# Patient Record
Sex: Female | Born: 1970 | Race: White | Hispanic: No | Marital: Single
Health system: Midwestern US, Community
[De-identification: ages and names within clinical notes are randomized; demographics above are authoritative.]

## PROBLEM LIST (undated history)

## (undated) DIAGNOSIS — D500 Iron deficiency anemia secondary to blood loss (chronic): Secondary | ICD-10-CM

## (undated) DIAGNOSIS — R6510 Systemic inflammatory response syndrome (SIRS) of non-infectious origin without acute organ dysfunction: Secondary | ICD-10-CM

## (undated) DIAGNOSIS — R7989 Other specified abnormal findings of blood chemistry: Secondary | ICD-10-CM

## (undated) DIAGNOSIS — S27809A Unspecified injury of diaphragm, initial encounter: Secondary | ICD-10-CM

## (undated) DIAGNOSIS — R519 Headache, unspecified: Secondary | ICD-10-CM

## (undated) DIAGNOSIS — H8109 Meniere's disease, unspecified ear: Secondary | ICD-10-CM

## (undated) DIAGNOSIS — I10 Essential (primary) hypertension: Secondary | ICD-10-CM

## (undated) DIAGNOSIS — N951 Menopausal and female climacteric states: Secondary | ICD-10-CM

## (undated) DIAGNOSIS — D501 Sideropenic dysphagia: Secondary | ICD-10-CM

## (undated) DIAGNOSIS — K9041 Non-celiac gluten sensitivity: Secondary | ICD-10-CM

## (undated) DIAGNOSIS — F329 Major depressive disorder, single episode, unspecified: Secondary | ICD-10-CM

## (undated) DIAGNOSIS — R5382 Chronic fatigue, unspecified: Secondary | ICD-10-CM

## (undated) DIAGNOSIS — R42 Dizziness and giddiness: Secondary | ICD-10-CM

## (undated) DIAGNOSIS — F32A Depression, unspecified: Secondary | ICD-10-CM

## (undated) DIAGNOSIS — G43909 Migraine, unspecified, not intractable, without status migrainosus: Secondary | ICD-10-CM

## (undated) DIAGNOSIS — J45909 Unspecified asthma, uncomplicated: Secondary | ICD-10-CM

## (undated) DIAGNOSIS — F4310 Post-traumatic stress disorder, unspecified: Secondary | ICD-10-CM

## (undated) DIAGNOSIS — F419 Anxiety disorder, unspecified: Secondary | ICD-10-CM

## (undated) HISTORY — DX: Anxiety disorder, unspecified: F41.9

## (undated) HISTORY — DX: Non-celiac gluten sensitivity: K90.41

## (undated) HISTORY — DX: Major depressive disorder, single episode, unspecified: F32.9

## (undated) HISTORY — DX: Dizziness and giddiness: R42

## (undated) HISTORY — PX: OVARIAN CYST REMOVAL: SHX89

## (undated) HISTORY — DX: Meniere's disease, unspecified ear: H81.09

## (undated) HISTORY — DX: Unspecified asthma, uncomplicated: J45.909

## (undated) HISTORY — DX: Migraine, unspecified, not intractable, without status migrainosus: G43.909

## (undated) HISTORY — DX: Sideropenic dysphagia: D50.1

## (undated) HISTORY — DX: Depression, unspecified: F32.A

## (undated) HISTORY — DX: Post-traumatic stress disorder, unspecified: F43.10

## (undated) HISTORY — DX: Essential (primary) hypertension: I10

## (undated) HISTORY — PX: HIATAL HERNIA REPAIR: SHX195

## (undated) HISTORY — DX: Chronic fatigue, unspecified: R53.82

## (undated) HISTORY — DX: Menopausal and female climacteric states: N95.1

---

## 2014-09-03 ENCOUNTER — Ambulatory Visit
Admit: 2014-09-03 | Discharge: 2014-09-03 | Payer: PRIVATE HEALTH INSURANCE | Attending: Internal Medicine | Primary: Internal Medicine

## 2014-09-03 DIAGNOSIS — D500 Iron deficiency anemia secondary to blood loss (chronic): Secondary | ICD-10-CM

## 2014-09-03 NOTE — Telephone Encounter (Signed)
Patient says the Psychiatrist referred does not accept her insurance nor are they accepting new patients. She' requesting another. She also says that she needs a refill on her Ferroset didn't see on the list.

## 2014-09-03 NOTE — Progress Notes (Signed)
This is a new patient named Sandra KaufmanRobin Heinzelman, a 44 y.o. white female who has a long history of depression, a long history of mild intermittent asthma, who recently has been quite ill.  She was found by her primary doctor to be very anemic and iron deficient.  She gets no menstrual cycles.  Her hemoglobin dropped suddenly from 9 to 5.5 over a three week period.  She was hospitalized and had a full GI workup.  Had four or five units of blood.  Had IV iron.  She brings me in a hemoglobin of 5.5 at that time, she had an MCV of 78.  Electrolytes were normal.      The patient's hospital stay included an EGD and colonoscopy.  She has a large hiatal hernia which is old.  They saw no source of bleeding.  Dr. Dimitri PedHaverty did her GI workup.  She is now following with Dr. Nunzio CoryLyons as an outpatient.  Her iron sat was 8%.  Her iron was 39.  Total iron binding capacity was 518.  Patient was not taking any NSAID's.  She is now taking ferrous sulfate 325 three times daily.      Two days after her hospital stay, she got sick with shortness of breath and fever and was hospitalized with bilateral pneumonia.  It was felt to either be a systemic inflammatory response syndrome, or ARDS, or pneumonia.  Cultures were negative.  Mycoplasma testing is pending.  She was intubated on a ventilator for five days, treated with high doses of steroids, high doses of antibiotics, and improved.  She has been home for about a week.      She takes Nexium twice a day, iron daily.  She is on 80 mg of Prozac, 800 mg of Gabapentin at night for sleep.  She uses occasional Fioricet.  She uses Albuterol and she uses Symbicort twice a day.  She is on no aspirin products.      She has a follow up appointment with pulmonary.  Has a follow up appointment with GI.  She has not had her blood redone.      Physical Examination:  She is feeling better.  She looks well.  She is a little cushingoid.  Her blood pressure was normal.  Her chest was clear.   S1, S2 was regular.  Abdomen soft.  Was not pale.      Impression:  GI bleed.  Hemorrhage may be related to the large hiatal hernia.  She tells me there is consideration of doing a hiatal hernia repair.  I warned her to make sure she has a Careers advisersurgeon who is very accomplished at that procedure.  I suggested that the GI workup may have missed the bleeding area.  Capsule endoscopy is in the plans, and possibly repair the hiatal hernia is in the plans.  Her depression is ongoing, needs a psychiatrist.  Followed by Dr. Albertha GheeSherman Masters for many years and he is no longer in town.  He is retired.  She really has not found anybody and does not feel like her present medications are helping enough.  She has been on multiple other meds.  Has never had a psych admission.  The asthma has been pretty stable over the years.  Presently using Symbicort without using any breakthrough inhalers.      Plan:  Recommendation would be repeat the labs now.  Psychiatry referral, I gave her the name of Dr. Arletha PiliPiedra as a possible source to follow with her.  I recommended that she keep her follow up with pulmonary, keep her follow up with GI.  Have them enclose follow up letters to me.  Labs now.  IV infusion of iron would be a consideration if she remains iron deficient.  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  Continue on the proton pump inhibitors at present dosage for now.     Chief Complaint   Patient presents with   ??? Establish Care     low hemoglobin given 3 bloods transfusions, double PNA, finishing Prednisone     Filed Vitals:    09/03/14 1043   BP: 143/92   Pulse: 85   Temp: 99 ??F (37.2 ??C)   TempSrc: Oral   Resp: 16   Height:  (1.549 m)   Weight: 195 lb (88.451 kg)   SpO2: 98%   neuro normal  Skin normal  Joints normal  Mood sad  Neck normal no mass  \\Alyla was seen today for establish care.    Diagnoses and all orders for this visit:    Iron deficiency anemia due to chronic blood loss  Orders:  -     CBC WITH AUTOMATED DIFF   -     FERRITIN  -     METABOLIC PANEL, COMPREHENSIVE  -     IRON PROFILE    Hiatal hernia with GERD and esophagitis  Orders:  -     CBC WITH AUTOMATED DIFF  -     FERRITIN  -     METABOLIC PANEL, COMPREHENSIVE  -     IRON PROFILE    Mild persistent asthma without complication  Orders:  -     CBC WITH AUTOMATED DIFF  -     FERRITIN  -     METABOLIC PANEL, COMPREHENSIVE  -     IRON PROFILE    ARDS (adult respiratory distress syndrome) (HCC)    Recurrent major depressive disorder, remission status unspecified (HCC)        ARDS seems resolved   Later prevnar  Depression discussed    Prolonged visit with greater than 50% of time of more than 25 minute visit spent counseling patient and family and formulation of care

## 2014-09-04 LAB — CBC WITH AUTOMATED DIFF
ABS. BASOPHILS: 0 10*3/uL (ref 0.0–0.2)
ABS. EOSINOPHILS: 0.1 10*3/uL (ref 0.0–0.4)
ABS. IMM. GRANS.: 0.1 10*3/uL (ref 0.0–0.1)
ABS. MONOCYTES: 0.6 10*3/uL (ref 0.1–0.9)
ABS. NEUTROPHILS: 8.6 10*3/uL — ABNORMAL HIGH (ref 1.4–7.0)
Abs Lymphocytes: 0.6 10*3/uL — ABNORMAL LOW (ref 0.7–3.1)
BASOPHILS: 0 %
EOSINOPHILS: 1 %
HCT: 31.6 % — ABNORMAL LOW (ref 34.0–46.6)
HGB: 9.6 g/dL — ABNORMAL LOW (ref 11.1–15.9)
IMMATURE GRANULOCYTES: 1 %
Lymphocytes: 6 %
MCH: 25 pg — ABNORMAL LOW (ref 26.6–33.0)
MCHC: 30.4 g/dL — ABNORMAL LOW (ref 31.5–35.7)
MCV: 82 fL (ref 79–97)
MONOCYTES: 6 %
NEUTROPHILS: 86 %
PLATELET: 191 10*3/uL (ref 150–379)
RBC: 3.84 x10E6/uL (ref 3.77–5.28)
RDW: 19.3 % — ABNORMAL HIGH (ref 12.3–15.4)
WBC: 10 10*3/uL (ref 3.4–10.8)

## 2014-09-04 LAB — METABOLIC PANEL, COMPREHENSIVE
A-G Ratio: 2 (ref 1.1–2.5)
ALT (SGPT): 36 IU/L — ABNORMAL HIGH (ref 0–32)
AST (SGOT): 23 IU/L (ref 0–40)
Albumin: 4.2 g/dL (ref 3.5–5.5)
Alk. phosphatase: 63 IU/L (ref 39–117)
BUN/Creatinine ratio: 17 (ref 9–23)
BUN: 14 mg/dL (ref 6–24)
Bilirubin, total: 0.3 mg/dL (ref 0.0–1.2)
CO2: 24 mmol/L (ref 18–29)
Calcium: 8.9 mg/dL (ref 8.7–10.2)
Chloride: 99 mmol/L (ref 97–108)
Creatinine: 0.84 mg/dL (ref 0.57–1.00)
GFR est AA: 97 mL/min/{1.73_m2} (ref >59)
GFR est non-AA: 84 mL/min/{1.73_m2} (ref >59)
GLOBULIN, TOTAL: 2.1 g/dL (ref 1.5–4.5)
Glucose: 111 mg/dL — ABNORMAL HIGH (ref 65–99)
Potassium: 3.7 mmol/L (ref 3.5–5.2)
Protein, total: 6.3 g/dL (ref 6.0–8.5)
Sodium: 140 mmol/L (ref 134–144)

## 2014-09-04 LAB — IRON PROFILE
Iron % saturation: 48 % (ref 15–55)
Iron: 183 ug/dL — ABNORMAL HIGH (ref 27–159)
TIBC: 378 ug/dL (ref 250–450)
UIBC: 195 ug/dL (ref 131–425)

## 2014-09-04 LAB — FERRITIN: Ferritin: 45 ng/mL (ref 15–150)

## 2014-09-06 NOTE — Progress Notes (Signed)
Quick Note:        Hemoglobin 9.6 is Better with normal iron sats    Should repeat hemoglobin in a month    ______

## 2014-09-07 NOTE — Progress Notes (Signed)
Quick Note:        Letter sent, attempted to reach patient.    ______

## 2014-09-07 NOTE — Telephone Encounter (Signed)
Notified Kamaiyah per Dr Janace ArisBrengel that Fioricet is not advised with pain meds.  She states that she is not taking any pain meds and she uses Fioricet for migraines.  In addition, she needs to call around for psych on her own.  Provided her with contact information for [facility].  Please advise.

## 2014-09-14 ENCOUNTER — Ambulatory Visit: Admit: 2014-09-14 | Payer: PRIVATE HEALTH INSURANCE | Attending: Physician Assistant | Primary: Internal Medicine

## 2014-09-14 DIAGNOSIS — R519 Headache, unspecified: Secondary | ICD-10-CM

## 2014-09-14 LAB — AMB POC URINALYSIS DIP STICK MANUAL W/O MICRO
Blood (UA POC): NEGATIVE
Glucose (UA POC): NEGATIVE
Nitrites (UA POC): NEGATIVE
Specific gravity (UA POC): 1.03 (ref 1.001–1.035)
Urobilinogen (UA POC): 0.2 (ref 0.2–1)
pH (UA POC): 5.5 (ref 4.6–8.0)

## 2014-09-14 MED ORDER — BUTALBITAL-ACETAMINOPHEN-CAFFEINE 50 MG-325 MG-40 MG TAB
50-325-40 mg | ORAL_TABLET | Freq: Four times a day (QID) | ORAL | Status: DC | PRN
Start: 2014-09-14 — End: 2014-10-16

## 2014-09-14 MED ORDER — CODEINE-BUTALBITAL-ACETAMINOPHEN-CAFFEINE 30 MG-50 MG-325 MG-40 MG CAP
50-325-40-30 mg | ORAL_CAPSULE | Freq: Four times a day (QID) | ORAL | Status: DC | PRN
Start: 2014-09-14 — End: 2014-09-14

## 2014-09-14 NOTE — Progress Notes (Signed)
Reviewed record in preparation for visit and have obtained necessary documentation.    Identified pt with two pt identifiers(name and DOB).      Health Maintenance Due   Topic   ??? Pneumococcal 19-64 Medium Risk (1 of 1 - PPSV23)   ??? DTaP/Tdap/Td series (1 - Tdap)   ??? PAP AKA CERVICAL CYTOLOGY          Chief Complaint   Patient presents with   ??? Skin Problem     Iritation of the urethra, itching, patient had a catheter, approx 2 weeks,    ??? Migraine     Had a headache for 5 days, not presenting like a migraine,         Wt Readings from Last 3 Encounters:   09/14/14 189 lb (85.73 kg)   09/03/14 195 lb (88.451 kg)     Temp Readings from Last 3 Encounters:   09/14/14 97.8 ??F (36.6 ??C) Oral   09/03/14 99 ??F (37.2 ??C) Oral     BP Readings from Last 3 Encounters:   09/14/14 165/88   09/03/14 143/92     Pulse Readings from Last 3 Encounters:   09/14/14 104   09/03/14 85           Learning Assessment:  :     Learning Assessment 09/03/2014   PRIMARY LEARNER Patient   PRIMARY LANGUAGE ENGLISH   LEARNER PREFERENCE PRIMARY READING     LISTENING   ANSWERED BY patient   RELATIONSHIP SELF       Depression Screening:  :     No flowsheet data found.    Fall Risk Assessment:  :     No flowsheet data found.    Abuse Screening:  :     Abuse Screening Questionnaire 09/14/2014   Do you ever feel afraid of your partner? N   Are you in a relationship with someone who physically or mentally threatens you? N   Is it safe for you to go home? Y       Coordination of Care Questionnaire:  :     1) Have you been to an emergency room, urgent care clinic since your last visit? no   Hospitalized since your last visit? no             2) Have you seen or consulted any other health care providers outside of [facility] since your last visit? no  (Include any pap smears or colon screenings in this section.)    3) Do you have an Advance Directive on file? no    4) Are you interested in receiving information on Advance Directives? YES       Patient is accompanied by self I have received verbal consent from Sandra White to discuss any/all medical information while they are present in the room.

## 2014-09-14 NOTE — Patient Instructions (Signed)
MyChart Activation    Thank you for requesting access to MyChart. Please follow the instructions below to securely access and download your online medical record. MyChart allows you to send messages to your doctor, view your test results, renew your prescriptions, schedule appointments, and more.    How Do I Sign Up?    1. In your internet browser, go to [email or web address]  2. Click on the First Time User? Click Here link in the Sign In box. You will be redirect to the New Member Sign Up page.  3. Enter your MyChart Access Code exactly as it appears below. You will not need to use this code after you???ve completed the sign-up process. If you do not sign up before the expiration date, you must request a new code.    MyChart Access Code: [email or web address]  Expires: 12/08/2014  3:21 PM (This is the date your MyChart access code will expire)    4. Enter the last four digits of your Social Security Number (xxxx) and Date of Birth (mm/dd/yyyy) as indicated and click Submit. You will be taken to the next sign-up page.  5. Create a MyChart ID. This will be your MyChart login ID and cannot be changed, so think of one that is secure and easy to remember.  6. Create a MyChart password. You can change your password at any time.  7. Enter your Password Reset Question and Answer. This can be used at a later time if you forget your password.   8. Enter your e-mail address. You will receive e-mail notification when new information is available in MyChart.  9. Click Sign Up. You can now view and download portions of your medical record.  10. Click the Download Summary menu link to download a portable copy of your medical information.    Additional Information    If you have questions, please visit the Frequently Asked Questions section of the MyChart website at [email or web address] Remember, MyChart is NOT to be used for urgent needs. For medical emergencies, dial 911.

## 2014-09-14 NOTE — Progress Notes (Signed)
HISTORY OF PRESENT ILLNESS  Sandra White is a 44 y.o. female.  HPI  Patient presents to the office for evaluation of headache and urethra irritation. She states she has a history of headaches but she has not seen a neurologist in many years. She reports she has been having a headache off and on now for about 5 days. She reports it feels like a band across the front of her head. She reports once it got so bad that she felt nauseated. She states she has been under some stress with trying to recuperate from her hospitalization. Also while here she states she has been having some urethra irritation. She denies having painful urination, frequency, or hesitancy. She states the irritation/itch is similar to when she has a yeast infection but she is not having vaginal discharge. She states soaks in epson salt has helped.  Review of Systems   Genitourinary: Negative for dysuria, urgency, frequency, hematuria and flank pain.   Neurological: Positive for headaches.     Blood pressure 165/88, pulse 104, temperature 97.8 ??F (36.6 ??C), temperature source Oral, resp. rate 20, height 5\' 1"  (1.549 m), weight 189 lb (85.73 kg), SpO2 99 %.    Physical Exam   Cardiovascular: Normal rate and regular rhythm.    No murmur heard.  Pulmonary/Chest: Effort normal and breath sounds normal.   Abdominal: Soft. Bowel sounds are normal. She exhibits no distension. There is no tenderness. There is no rebound.   Musculoskeletal: She exhibits no tenderness.       ASSESSMENT and PLAN  [name] was seen today for skin problem and migraine.    Diagnoses and all orders for this visit:    Headache, unspecified headache type  Orders:  -     Discontinue: codeine-butalbital-acetaminophen-caffeine (FIORICET WITH CODEINE) 50-325-40-30 mg per capsule; Take 1 Cap by mouth every six (6) hours as needed for Headache. Max Daily Amount: 4 Caps.  -     butalbital-acetaminophen-caffeine (FIORICET, ESGIC) 50-325-40 mg per  tablet; Take 1 Tab by mouth every six (6) hours as needed for Pain. Max Daily Amount: 4 Tabs.    Urethral irritation    advised patient if she has to take the fioricet often for headaches she should make an appointment to follow up with Dr. Janace Aris. I will send her urine for a culture. Will contact her once results are back.

## 2014-09-15 LAB — CULTURE, URINE
Urine Culture, Routine: NO GROWTH
Urine Culture, Routine: NO GROWTH

## 2014-09-15 NOTE — Progress Notes (Signed)
Quick Note:        Please let patient know no growth on culture. thanks    ______

## 2014-09-16 NOTE — Progress Notes (Signed)
Quick Note:        Writer contacted patient to inform her of lab results per Lambert Mody, patient understood and stated still having irritation, writer encouraged patient to make appointment with Dr. Janace Aris if irritation persists.    ______

## 2014-09-18 ENCOUNTER — Telehealth

## 2014-09-18 NOTE — Telephone Encounter (Signed)
Pt is requesting a referral she was seen by Dr. Theodoro Parma this morning at 9:45am @ [facility] on [address]  She was seen for the following: K44.9, K21.9, D50.0, and Z87.0. Phone # is [phone] and fax is [phone] NPI: [id number] Tax id: [id number]. They are referring pt to general surg. Pt will call back MON with details of that appt.

## 2014-09-23 ENCOUNTER — Telehealth

## 2014-09-23 NOTE — Telephone Encounter (Signed)
Patient called and stated that she needs a referral  Referring Provider: Dr Gae Gallop  Specialty: Pulmonary  Reason for Appointment: Hospital follow up from Frann Rider for respatory failure    Appointment Date and Time: June 29th at 8:30am  Patient also, mentioned that she needed an order for a chest x-ray because [facility] would like for her to have an x-ray of her chest before her appointment. The contact number is [phone].

## 2014-09-23 NOTE — Telephone Encounter (Signed)
Patient called back and stated that she does not need the order for the chest x-ray. The contact number is [phone].

## 2014-10-07 ENCOUNTER — Telehealth

## 2014-10-07 NOTE — Telephone Encounter (Signed)
Writer informed patient lab slip is ready for pick up

## 2014-10-07 NOTE — Telephone Encounter (Signed)
Patient wants to pick up lab slips for 1 month recheck. Call when ready.

## 2014-10-08 LAB — CBC WITH AUTOMATED DIFF
ABS. BASOPHILS: 0.1 10*3/uL (ref 0.0–0.2)
ABS. EOSINOPHILS: 0.5 10*3/uL — ABNORMAL HIGH (ref 0.0–0.4)
ABS. IMM. GRANS.: 0 10*3/uL (ref 0.0–0.1)
ABS. MONOCYTES: 0.3 10*3/uL (ref 0.1–0.9)
ABS. NEUTROPHILS: 5.1 10*3/uL (ref 1.4–7.0)
Abs Lymphocytes: 1.3 10*3/uL (ref 0.7–3.1)
BASOPHILS: 1 %
EOSINOPHILS: 7 %
HCT: 39 % (ref 34.0–46.6)
HGB: 12.8 g/dL (ref 11.1–15.9)
IMMATURE GRANULOCYTES: 0 %
Lymphocytes: 18 %
MCH: 27.5 pg (ref 26.6–33.0)
MCHC: 32.8 g/dL (ref 31.5–35.7)
MCV: 84 fL (ref 79–97)
MONOCYTES: 5 %
NEUTROPHILS: 69 %
PLATELET: 161 10*3/uL (ref 150–379)
RBC: 4.65 x10E6/uL (ref 3.77–5.28)
RDW: 18.7 % — ABNORMAL HIGH (ref 12.3–15.4)
WBC: 7.2 10*3/uL (ref 3.4–10.8)

## 2014-10-08 NOTE — Telephone Encounter (Signed)
Quick Note:        Not anemic any longer    ______

## 2014-10-09 NOTE — Telephone Encounter (Signed)
Quick Note:        Writer informed patient per Dr Janace ArisBrengel labs have been reviewed patient is no longer anemic any longer. Patient is pleased    ______

## 2014-10-09 NOTE — Telephone Encounter (Signed)
Mailed recent lab results to address on file.

## 2014-10-09 NOTE — Telephone Encounter (Signed)
Patient called and would like for her test results mailed to her. The contact number is [phone].

## 2014-10-15 NOTE — Telephone Encounter (Signed)
Patient called and stated that she needs a referral:   Referring Provider: Dr. Carmell AustriaAmy Rose   Specialty: General Surgery  Reason for the Appointment: Haital Hernia Repair  Appointment date and time: 10/21/14 @ 1:45pm

## 2014-10-16 ENCOUNTER — Ambulatory Visit
Admit: 2014-10-16 | Discharge: 2014-10-16 | Payer: PRIVATE HEALTH INSURANCE | Attending: Internal Medicine | Primary: Internal Medicine

## 2014-10-16 DIAGNOSIS — D500 Iron deficiency anemia secondary to blood loss (chronic): Secondary | ICD-10-CM

## 2014-10-16 MED ORDER — FLUOXETINE 40 MG CAP
40 mg | ORAL_CAPSULE | Freq: Every day | ORAL | Status: DC
Start: 2014-10-16 — End: 2015-02-22

## 2014-10-16 MED ORDER — BUTALBITAL-ACETAMINOPHEN-CAFFEINE 50 MG-325 MG-40 MG TAB
50-325-40 mg | ORAL_TABLET | Freq: Four times a day (QID) | ORAL | Status: DC | PRN
Start: 2014-10-16 — End: 2015-02-22

## 2014-10-16 MED ORDER — NORTRIPTYLINE 25 MG CAP
25 mg | ORAL_CAPSULE | ORAL | Status: DC
Start: 2014-10-16 — End: 2015-03-22

## 2014-10-16 MED ORDER — PROMETHAZINE 12.5 MG TAB
12.5 mg | ORAL_TABLET | Freq: Four times a day (QID) | ORAL | Status: AC | PRN
Start: 2014-10-16 — End: ?

## 2014-10-16 NOTE — Progress Notes (Signed)
Chief Complaint   Patient presents with   ??? Migraine     Frequent migraines, getting worse, Pt is seeing neurologist in aug   ??? Medication Refill     promethazine, fiorcet     Vomits nausea    Ever since her hospitalization for severe anemia and ARDS in May, she has had a lot of headaches.  She has an appointment to see Dr. Peri Maris in a couple of weeks.  She previously had used Topamax.  She has been taking some Fioricet but is reluctant to use it.  She has been taking some acetaminophen.  She is not supposed to take NSAID's because of her GI bleed.  It is not clear where she had a GI bleed from, but they think it is from her hiatal hernia.  She has been referred to General Surgery and for Amy Rose for hiatal hernia repair, which is probably a good idea.  She continues on a proton pump inhibitor.  She has been under a moderate amount of stress trying to get her life together.  Her most recent CBC was normal.  She has had classic migraines for years, but much worse lately.  She said she never had good relief with triptans, although only remembers one or two that she has taken before.      Physical Examination:  Her exam was benign.  Nothing significant.  Her blood pressure was normal.  Neck was supple.      Impression/Plan:  I recommended a trial of Nortriptyline.  Try 25 mg at bedtime, titrate up to 75 mg at bedtime.  Use that instead of Gabapentin as a migraine prophylaxis.  A small amount of Fioricet was given with warnings about rebound headaches.  Neurology appointment.  I refilled her antidepressants until she gets a psychiatrist.  So far has not been able to find a psychiatrist under her plan that is accepting new patients, which is really unfortunate.  The [facility] psychiat[facility] group, mental health group, also is taking no new patients which is also quite unfortunate.  I do not think we should be taking these [facility] patients if they cannot get psychiatric care or mental health care.  Therefore I  have filled those meds.  I will see her back in a few months.  Referral to Amy Rose for general surgery for hiatal hernia repair.  I think she is the best in the community at the procedure, has a lot of experience so I feel comfortable that she would be the best person to do that procedure for her.  I do not know of anybody else in the community that does frequent hiatal hernia repairs and is skilled at it.     Filed Vitals:    10/16/14 0854   BP: 157/107   Pulse: 111   Temp: 98.4 ??F (36.9 ??C)   TempSrc: Oral   Resp: 16   Height:  (1.549 m)   Weight: 186 lb (84.369 kg)     Sandra White was seen today for migraine and medication refill.    Diagnoses and all orders for this visit:    Iron deficiency anemia due to chronic blood loss  Orders:  -     FLUoxetine (PROZAC) 40 mg capsule; Take 2 Caps by mouth daily.    Hiatal hernia with GERD and esophagitis  Orders:  -     FLUoxetine (PROZAC) 40 mg capsule; Take 2 Caps by mouth daily.  -     REFERRAL TO GENERAL  SURGERY    Mild persistent asthma without complication  Orders:  -     FLUoxetine (PROZAC) 40 mg capsule; Take 2 Caps by mouth daily.    Headache, unspecified headache type  Orders:  -     butalbital-acetaminophen-caffeine (FIORICET, ESGIC) 50-325-40 mg per tablet; Take 1 Tab by mouth every six (6) hours as needed for Pain. Max Daily Amount: 4 Tabs.    Chronic daily headache    Other orders  -     nortriptyline (PAMELOR) 25 mg capsule; One at HS, increase up 3 at HS  -     promethazine (PHENERGAN) 12.5 mg tablet; Take 1 Tab by mouth every six (6) hours as needed for Nausea.

## 2014-10-29 LAB — AMB EXT CREATININE: Creatinine, External: 0.73

## 2014-11-03 ENCOUNTER — Encounter: Attending: Specialist | Primary: Internal Medicine

## 2014-12-21 ENCOUNTER — Encounter

## 2014-12-21 ENCOUNTER — Ambulatory Visit
Admit: 2014-12-21 | Discharge: 2014-12-21 | Payer: PRIVATE HEALTH INSURANCE | Attending: Specialist | Primary: Internal Medicine

## 2014-12-21 DIAGNOSIS — G43009 Migraine without aura, not intractable, without status migrainosus: Secondary | ICD-10-CM

## 2014-12-21 MED ORDER — SUMATRIPTAN 100 MG TAB
100 mg | ORAL_TABLET | Freq: Once | ORAL | 6 refills | Status: DC | PRN
Start: 2014-12-21 — End: 2015-11-15

## 2014-12-21 NOTE — Patient Instructions (Addendum)
Information Regarding Testing and Medication    If you have physican order for a test or a medication denied by your insurance company, this does not mean the test or medication is not appropriate for you as that is a medical decision, not a decision to be made by an insurance company representative or by an [employer] company physician who has not interviewed and examined you. This is a decision to be made between you and your physician.     The denial of services is a Personnel officercontractual matter between you and your insurance company, not an issue between your physician and the insurance company. If your test or medication is denied, you can take the following steps to help resolve the issue:    1.  File a complaint with the [employer] regarding your insurance company's denial of services ordered for you.  You can do this either by calling them directly or by completing an on-line complaint form on the [employer] Commission website.  This can be found at [email or web address]    2.   Also file a formal complaint with your insurance company and ask to have the name of the person denying the service so that you may explore a legal option should you be harmed by this denial of service.  Again, the fact the insurance company will not pay for the service does not mean it is not medically necessary and I would encourage you to follow through with the plan that was made with your physician    3.   File a written complaint with your employer so your employer and benefit manager is aware of the poor coverage they are providing their employees.  If you have medicare/medicaid, complain to your representative in the House and to your [place]ator.          PRESCRIPTION REFILL POLICY    [facility]   Statement to Patients  July 02, 2012      In an effort to ensure the large volume of patient prescription refills is processed in the most efficient and expeditious manner, we are asking our  patients to assist us by calling your Pharmacy for all prescription refills, this will include also your  Mail Order Pharmacy. The pharmacy will contact our office electronically to continue the refill process.    Please do not wait until the last minute to call your pharmacy. We need at least 48 hours (2days) to fill prescriptions. We also encourage you to call your pharmacy before going to pick up your prescription to make sure it is ready.     With regard to controlled substance prescription refill requests (narcotic refills) that need to be picked up at our office, we ask your cooperation by providing us with at least 72 hours (3days) notice that you will need a refill.    We will not refill narcotic prescription refill requests after 4:00pm on any weekday, Monday through Thursday, or after 2:00pm on Fridays, or on the weekends.      We encourage everyone to explore another way of getting your prescription refill request processed using MyChart, our patient web portal through our electronic medical record system. MyChart is an efficient and effective way to communicate your medication request directly to the office and  downloadable as an app on your smart phone . MyChart also features a review functionality that allows you to view your medication list as well as leave messages for your physician. Are you ready to  get connected? If so please review the attatched instructions or speak to any of our staff to get you set up right away!    Thank you so much for your cooperation. Should you have any questions please contact our Research officer, political party.    The Physicians and Staff,  [facility]               Migraine Headache: Care Instructions  Your Care Instructions  Migraines are painful, throbbing headaches that often start on one side of the head. They may cause nausea and vomiting and make you sensitive to light, sound, or smell.  Without treatment, migraines can last from 4 hours to a few days.  Medicines can help prevent migraines or stop them after they have started. Your doctor can help you find which ones work best for you.  Follow-up care is a key part of your treatment and safety. Be sure to make and go to all appointments, and call your doctor if you are having problems. It's also a good idea to know your test results and keep a list of the medicines you take.  How can you care for yourself at home?  ?? Do not drive if you have taken a prescription pain medicine.  ?? Rest in a quiet, dark room until your headache is gone. Close your eyes, and try to relax or go to sleep. Don't watch TV or read.  ?? Put a cold, moist cloth or cold pack on the painful area for 10 to 20 minutes at a time. Put a thin cloth between the cold pack and your skin.  ?? Use a warm, moist towel or a heating pad set on low to relax tight shoulder and neck muscles.  ?? Have someone gently massage your neck and shoulders.  ?? Take your medicines exactly as prescribed. Call your doctor if you think you are having a problem with your medicine. You will get more details on the specific medicines your doctor prescribes.  ?? Be careful not to take pain medicine more often than the instructions allow. You could get worse or more frequent headaches when the medicine wears off.  To prevent migraines  ?? Keep a headache diary so you can figure out what triggers your headaches. Avoiding triggers may help you prevent headaches. Record when each headache began, how long it lasted, and what the pain was like. (Was it throbbing, aching, stabbing, or dull?) Write down any other symptoms you had with the headache, such as nausea, flashing lights or dark spots, or sensitivity to bright light or loud noise. Note if the headache occurred near your period. List anything that might have triggered the headache. Triggers may include certain foods (chocolate, cheese, wine) or odors, smoke, bright light, stress, or lack of sleep.   ?? If your doctor has prescribed medicine for your migraines, take it as directed. You may have medicine that you take only when you get a migraine and medicine that you take all the time to help prevent migraines.  ?? If your doctor has prescribed medicine for when you get a headache, take it at the first sign of a migraine, unless your doctor has given you other instructions.  ?? If your doctor has prescribed medicine to prevent migraines, take it exactly as prescribed. Call your doctor if you think you are having a problem with your medicine.  ?? Find healthy ways to deal with stress. Migraines are most common during or right after stressful times.  Take time to relax before and after you do something that has caused a migraine in the past.  ?? Try to keep your muscles relaxed by keeping good posture. Check your jaw, face, neck, and shoulder muscles for tension. Try to relax them. When you sit at a desk, change positions often. And make sure to stretch for 30 seconds each hour.  ?? Get plenty of sleep and exercise.  ?? Eat meals on a regular schedule. Avoid foods and drinks that often trigger migraines. These include chocolate, alcohol (especially red wine and port), aspartame, monosodium glutamate (MSG), and some additives found in foods (such as hot dogs, bacon, cold cuts, aged cheeses, and pickled foods).  ?? Limit caffeine. Don't drink too much coffee, tea, or soda. But don't quit caffeine suddenly. That can also give you migraines.  ?? Do not smoke or allow others to smoke around you. If you need help quitting, talk to your doctor about stop-smoking programs and medicines. These can increase your chances of quitting for good.  ?? If you are taking birth control pills or hormone therapy, talk to your doctor about whether they are triggering your migraines.  When should you call for help?  Call 911 anytime you think you may need emergency care. For example, call if:  ?? You have signs of a stroke. These may include:   ?? Sudden numbness, paralysis, or weakness in your face, arm, or leg, especially on only one side of your body.  ?? Sudden vision changes.  ?? Sudden trouble speaking.  ?? Sudden confusion or trouble understanding simple statements.  ?? Sudden problems with walking or balance.  ?? A sudden, severe headache that is different from past headaches.  Call your doctor now or seek immediate medical care if:  ?? You have new or worse nausea and vomiting.  ?? You have a new or higher fever.  ?? Your headache gets much worse.  Watch closely for changes in your health, and be sure to contact your doctor if:  ?? You are not getting better after 2 days (48 hours).  Where can you learn more?  Go to InsuranceStats.ca  Enter [phone] in the search box to learn more about "Migraine Headache: Care Instructions."  ?? 2006-2016 [employer]wise, Inc[employer]. Care instructions adapted under license by [employer] (which disclaims liability or warranty for this information). This care instruction is for use with your licensed healthcare professional. If you have questions about a medical condition or this instruction, always ask your healthcare professional. Healthwise, Incorporated disclaims any warranty or liability for your use of this information.  Content Version: 10.9.538570; Current as of: May 22, 2014        Recurring Migraine Headache: Care Instructions  Your Care Instructions  Migraines are painful, throbbing headaches. They often start on one side of the head. They may cause nausea and vomiting and make you sensitive to light, sound, or smell. Some people may have only a few migraines throughout life. Others have them as often as several times a month.  The goal of treatment is to reduce the number of migraines you have and relieve your symptoms. Even with treatment, you may continue to have migraines. You play an important role in dealing with your headaches. Work  on avoiding things that seem to trigger your migraines. When you feel a headache coming on, act quickly to stop it before it gets worse.  Follow-up care is a key part of your treatment and safety. Be sure to  make and go to all appointments, and call your doctor if you are having problems. It's also a good idea to know your test results and keep a list of the medicines you take.  How can you care for yourself at home?  ?? Do not drive if you have taken a prescription pain medicine.  ?? Rest in a quiet, dark room until your headache is gone. Close your eyes and try to relax or go to sleep. Do not watch TV or read.  ?? Put a cold, moist cloth or cold pack on the painful area for 10 to 20 minutes at a time. Put a thin cloth between the cold pack and your skin.  ?? Have someone gently massage your neck and shoulders.  ?? Take your medicines exactly as prescribed. Call your doctor if you think you are having a problem with your medicine. You will get more details on the specific medicines your doctor prescribes.  To prevent migraines  ?? Keep a headache diary so you can figure out what triggers your headaches. Avoiding triggers may help you prevent headaches. Record when each headache began, how long it lasted, and what the pain was like. Use words like throbbing, aching, stabbing, or dull. Write down any other symptoms you had with the headache. These may include nausea, flashing lights or dark spots, or sensitivity to bright light or loud noise. Note if the headache occurred near your period. List anything that might have triggered the headache. Triggers may include certain foods (chocolate, cheese, wine) or odors, smoke, bright light, stress, or lack of sleep.  ?? If your doctor has prescribed medicine for your migraines, take it as directed. You may have medicine that you take only when you get a migraine and medicine that you take all the time to help prevent migraines.   ?? If your doctor has prescribed medicine for when you get a headache, take it at the first sign of a migraine, unless your doctor has given you other instructions.  ?? If your doctor has prescribed medicine to prevent migraines, take it exactly as prescribed. Call your doctor if you think you are having a problem with your medicine.  ?? Find healthy ways to deal with stress. Migraines are most common during or right after stressful times. Take time to relax before and after you do something that has caused a migraine in the past.  ?? Try to keep your muscles relaxed by keeping good posture. Check your jaw, face, neck, and shoulder muscles for tension. Try to relax them. When sitting at a desk, change positions often. Stretch for 30 seconds each hour.  ?? Get regular sleep and exercise.  ?? Eat regular meals, and avoid foods and drinks that often trigger migraines. These include chocolate and alcohol, especially red wine and port. Chemicals used in food, such as aspartame and monosodium glutamate (MSG), also can trigger migraines. So can some food additives, such as those found in hot dogs, bacon, cold cuts, aged cheeses, and pickled foods.  ?? Limit caffeine by not drinking too much coffee, tea, or soda. Do not quit caffeine suddenly, because that can also give you migraines.  ?? Do not smoke or allow others to smoke around you. If you need help quitting, talk to your doctor about stop-smoking programs and medicines. These can increase your chances of quitting for good.  ?? If you are taking birth control pills or hormone therapy, talk to your doctor about whether they are triggering your  migraines.  When should you call for help?  Call 911 anytime you think you may need emergency care. For example, call if:  ?? You have symptoms of a stroke. These may include:  ?? Sudden numbness, tingling, weakness, or loss of movement in your face, arm, or leg, especially on only one side of your body.  ?? Sudden vision changes.   ?? Sudden trouble speaking.  ?? Sudden confusion or trouble understanding simple statements.  ?? Sudden problems with walking or balance.  ?? A sudden, severe headache that is different from past headaches.  Call your doctor now or seek immediate medical care if:  ?? You develop a fever and a stiff neck.  ?? You have new nausea and vomiting, or you cannot keep down food or liquids.  Watch closely for changes in your health, and be sure to contact your doctor if:  ?? You have a headache that does not get better within 1 or 2 days.  ?? Your headaches get worse or happen more often.  Where can you learn more?  Go to InsuranceStats.ca  Enter V975 in the search box to learn more about "Recurring Migraine Headache: Care Instructions."  ?? 2006-2016 Healthwise, Incorporated. Care instructions adapted under license by [employer] (which disclaims liability or warranty for this information). This care instruction is for use with your licensed healthcare professional. If you have questions about a medical condition or this instruction, always ask your healthcare professional. Healthwise, Incorporated disclaims any warranty or liability for your use of this information.  Content Version: 10.9.538570; Current as of: May 22, 2014        Deciding About Taking Medicine to Prevent Migraines  What are migraines?  Migraines are painful, throbbing headaches. They can last from 4 to 72 hours. They often occur on only one side of your head. But you may feel them on both sides. The pain may keep you from doing your daily activities.  You may take a daily medicine if you get bad migraines often. This can help prevent them.  What are key points about this decision?  ?? Medicines to prevent migraines may not stop them every time. But if you take them daily, you can reduce how many migraines you get by more than half. They can also reduce how long migraines last. And your symptoms may not be as bad.   ?? Medicines that prevent migraines may cause side effects. You may have sleep and memory problems, upset stomach, dry mouth, or constipation. Some of these side effects may last for as long as you take the medicine. Or they may go away within a few weeks.  Why might you choose to take medicine to prevent migraines?  ?? You are willing to take medicine daily if it will help your symptoms.  ?? You don't think the side effects of the medicine could be as bad as your migraine symptoms.  ?? Your migraines get in the way of your work. Or they harm your relationships with friends and family.  ?? Benefits of medicine include fewer or no migraines. And your migraines may not last as long or feel as bad.  Why might you choose not to take medicine to prevent migraines?  ?? You want to avoid the side effects of the medicine.  ?? You don't want to take medicine every day.  ?? Your migraines are not affecting your work and relationships.  ?? If your symptoms don't improve with home treatment and other medicines,  you can decide later to take medicine every day to help prevent migraines.  Your decision  Thinking about the facts and your feelings can help you make a decision that is right for you. Be sure you understand the benefits and risks of your options, and think about what else you need to do before you make the decision.  Where can you learn more?  Go to [employer]Stats.[employer]  Enter (313) [phone] in the search box to learn more about "Deciding About Taking Medicine to Prevent Migraines."  ?? 2006-2016 Healthwise, Inc[employer]. Care instructions adapted under license by [employer] (which disclaims liability or warranty for this information). This care instruction is for use with your licensed healthcare professional. If you have questions about a medical condition or this instruction, always ask your healthcare professional. Healthwise, Incorporated disclaims any warranty or liability for your use of this  information.  Content Version: 10.9.538570; Current as of: May 22, 2014  Will suggest patient migraines be treated with either Imitrex injectable or tablets either of which can be taken with over-the-counter ibuprofen or Aleve at moment 0.  Suggest she keep a headache diary and continue on the same dose of nortriptyline for now.  We can go higher.  Exam looks normal.

## 2014-12-21 NOTE — Progress Notes (Signed)
Reviewed record in preparation for visit and have necessary documentation  Pt did not bring medication to office visit for review  Information was given to pt on Advanced Directives, Living Will  opportunity was given for questions

## 2014-12-21 NOTE — Progress Notes (Signed)
Recurrent migraines.  Neurology Consult      Subjective:      Sandra White is a 44 y.o. 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  She saw me at my previous practice back in the 1990s for migraines.  Said an MRI was done at the time which was okay.  Headaches mellowed for a while down to 1 per month.  Then started fertility treatments in 2007 and headaches unfortunately became more intense and difficult to treat.  Was treated with Phenergan and Fioricet but headaches became worse any way.  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  Has recently been tapered on the Fioricet and nortriptyline put in place as a preventative drug at 75 mg daily currently.  Headaches seem to have responded.  Currently has 2 different type of headache presentations.  These are headaches with and without visual aura.  The headaches are otherwise the same.  With visual aura there is a preceding flashing of light in both eyes that last 10 or 15 minutes and then the headache starts.  Typically on the left side around the eye with nausea.  Headache can last between 4 hours or all day.  Enhanced by everything.  Diminished slightly with the Phenergan and is tapering off the Fioricet.  Says stress is definitely in the picture and sleep reveals chronic insomnia.  For some reason mentioned that obstructive sleep apnea was casually referenced at one point, but talking with the pulmonologist later it apparently was not a legitimate concern?    Current Outpatient Prescriptions   Medication Sig Dispense Refill   ??? mometasone-formoterol (DULERA) 100-5 mcg/actuation HFA inhaler Take 2 Puffs by inhalation two (2) times a day.     ??? tretinoin (RETIN-A) 0.025 % topical cream Apply  to affected area nightly.     ??? SUMAtriptan (IMITREX) 100 mg tablet Take 1 Tab by mouth once as needed for Migraine for up to 1 dose. 12 Tab 6   ??? nortriptyline (PAMELOR) 25 mg capsule One at HS, increase up 3 at HS (Patient taking differently: 75 mg. .   ) 90 Cap 5`   ??? butalbital-acetaminophen-caffeine (FIORICET, ESGIC) 50-325-40 mg per tablet Take 1 Tab by mouth every six (6) hours as needed for Pain. Max Daily Amount: 4 Tabs. 40 Tab 0   ??? promethazine (PHENERGAN) 12.5 mg tablet Take 1 Tab by mouth every six (6) hours as needed for Nausea. 50 Tab 1   ??? VENTOLIN HFA 90 mcg/actuation inhaler   12   ??? albuterol (PROVENTIL VENTOLIN) 2.5 mg /3 mL (0.083 %) nebulizer solution 2.5 mg by Nebulization route every eight (8) hours as needed.  1   ??? clonazePAM (KLONOPIN) 0.5 mg tablet Take 0.5-1 mg by mouth daily as needed.     ??? gabapentin (NEURONTIN) 800 mg tablet Take 800 mg by mouth nightly.     ??? pantoprazole (PROTONIX) 20 mg tablet   3   ??? FLUoxetine (PROZAC) 40 mg capsule Take 2 Caps by mouth daily. 60 Cap 1   ??? VITAMIN D2 50,000 unit capsule Take 50,000 Units by mouth every seven (7) days.  3   ??? ferrous sulfate 325 mg (65 mg iron) tablet Take 325 mg by mouth Daily (before breakfast).        Allergies   Allergen Reactions   ??? Prednisone Other (comments)     BLURRED VISION     Past Medical History   Diagnosis Date   ??? Anemia    ???  Anxiety    ??? Burning with urination    ??? Chronic mental illness    ??? Depression    ??? H/O seasonal allergies    ??? Headache 1997     migraines   ??? Incontinence in female    ??? Insomnia    ??? Premature ovarian failure      3-4 years ago      Past Surgical History   Procedure Laterality Date   ??? Hx ovarian cyst removal  1984   ??? Hx gyn     ??? Hx hernia repair  2016     hiatal hernia      Social History     Social History   ??? Marital status: SINGLE     Spouse name: N/A   ??? Number of children: N/A   ??? Years of education: N/A     Occupational History   ??? Not on file.     Social History Main Topics   ??? Smoking status: Former Smoker   ??? Smokeless tobacco: Never Used   ??? Alcohol use 1.8 oz/week     3 Glasses of wine per week      Comment: socially   ??? Drug use: No   ??? Sexual activity: No     Other Topics Concern   ??? Not on file      Social History Narrative      Family History   Problem Relation Age of Onset   ??? Hypertension Mother    ??? Heart Disease Mother    ??? Alcohol abuse Father    ??? Hypertension Father    ??? Cancer Father    ??? Heart Disease Father    ??? Stroke Father    ??? Breast Cancer Sister    ??? Cancer Sister       Visit Vitals   ??? BP 122/80   ??? Pulse (!) 113   ??? Temp 98.4 ??F (36.9 ??C) (Oral)   ??? Resp 18   ??? Ht  (1.549 m)   ??? Wt 83.6 kg (184 lb 3.2 oz)   ??? SpO2 98%   ??? BMI 34.8 kg/m2        Review of Systems:   A comprehensive review of systems was negative except for that written in the HPI.      Neuro Exam:     Appearance:  The patient is well developed, well nourished, provides a coherent history and is in no acute distress.   Mental Status: Oriented to time, place and person. Mood and affect appropriate.   Cranial Nerves:   Intact visual fields. Fundi are benign. PERLA, EOM's full, no nystagmus, no ptosis. Facial sensation is normal. Corneal reflexes are intact. Facial movement is symmetric. Hearing is normal bilaterally. Palate is midline with normal sternocleidomastoid and trapezius muscles are normal. Tongue is midline.   Motor:  5/5 strength in upper and lower proximal and distal muscles. Normal bulk and tone. No fasciculations.   Reflexes:   Deep tendon reflexes 2+/4 and symmetrical.   Sensory:   Normal to touch, pinprick and vibration.   Gait:  Normal gait.   Tremor:   No tremor noted.   Cerebellar:  No cerebellar signs present.   Neurovascular:  Normal heart sounds and regular rhythm, peripheral pulses intact, and no carotid bruits.            Assessment:   Recurrent migraines.  Will suggest she stay on the same dose of nortriptyline for now  but we could go higher or consider a different preventative agent.  Suggest she keep a headache diary.  Will dispense Imitrex injectable and tablets.  Either could be used at moment 0 with over-the-counter ibuprofen or Aleve.  Exam is reassuringly normal.      Plan:    Revisit in about 2 months.  Signed by :  Milana Na, MD 12/21/2014

## 2014-12-21 NOTE — Telephone Encounter (Signed)
Pt is requesting a referral to see Dr. Sim Boast (neurologist) for chronic migraines. Her appt. Is 12/21/14 @ 11 am.

## 2014-12-22 NOTE — Telephone Encounter (Signed)
[employer] PA form w/ clinical notes faxed regarding Zembrace 3/0.5 ml #0.5 /30 days

## 2014-12-28 NOTE — Telephone Encounter (Signed)
Received [place] [place] approval letter regarding Zembrace symtouch  /0.78ml #0.5 #30 days   Auth# [id number]  Effective 12/24/14-12/24/15    [facility] informed of approva; via fax# [phone]

## 2015-01-26 NOTE — Telephone Encounter (Signed)
Patient requesting a call back about vaccines my chart says she needs.

## 2015-01-26 NOTE — Telephone Encounter (Signed)
Spoke with the patient and she is going to call back to get scheduled to discuss the vaccines with Dr Janace ArisBrengel.

## 2015-02-22 ENCOUNTER — Ambulatory Visit
Admit: 2015-02-22 | Discharge: 2015-02-22 | Payer: PRIVATE HEALTH INSURANCE | Attending: Specialist | Primary: Internal Medicine

## 2015-02-22 DIAGNOSIS — G43709 Chronic migraine without aura, not intractable, without status migrainosus: Secondary | ICD-10-CM

## 2015-02-22 MED ORDER — PROMETHAZINE 25 MG TAB
25 mg | ORAL_TABLET | Freq: Four times a day (QID) | ORAL | 1 refills | Status: AC | PRN
Start: 2015-02-22 — End: ?

## 2015-02-22 NOTE — Progress Notes (Signed)
Neurology Consult      Subjective:      Sandra White is a 44 y.o. female who returns with chronic migraines.  Has had occasional periods of flareups that last more than several days.  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  Will suggest adding ibuprofen over-the-counter and gabapentin which she also takes and may need a Medrol Dosepak for future reference.  Is due to see psychiatry for PTSD.  Will increase nightly nortriptyline to 100 mg and may take up to 3 weeks to see any improvement for the sleeplessness and headache control.  Has nausea but cannot vomit due to hiatal hernia procedure.  Will issue Phenergan 25 mg for nausea and this may help the headache portion as well.  Exam looks normal and seems to be very cooperative and willing to try whatever it takes to assist her headache control.         Current Outpatient Prescriptions   Medication Sig Dispense Refill   ??? promethazine (PHENERGAN) 25 mg tablet Take 1 Tab by mouth every six (6) hours as needed for Nausea. 20 Tab 1   ??? mometasone-formoterol (DULERA) 100-5 mcg/actuation HFA inhaler Take 2 Puffs by inhalation two (2) times a day.     ??? pantoprazole (PROTONIX) 20 mg tablet   3   ??? nortriptyline (PAMELOR) 25 mg capsule One at HS, increase up 3 at HS (Patient taking differently: 75 mg. .  ) 90 Cap 5`   ??? VENTOLIN HFA 90 mcg/actuation inhaler   12   ??? gabapentin (NEURONTIN) 800 mg tablet Take 800 mg by mouth nightly.     ??? ferrous sulfate 325 mg (65 mg iron) tablet Take 325 mg by mouth Daily (before breakfast).     ??? tretinoin (RETIN-A) 0.025 % topical cream Apply  to affected area nightly.     ??? SUMAtriptan (IMITREX) 100 mg tablet Take 1 Tab by mouth once as needed for Migraine for up to 1 dose. 12 Tab 6   ??? promethazine (PHENERGAN) 12.5 mg tablet Take 1 Tab by mouth every six (6) hours as needed for Nausea. 50 Tab 1   ??? albuterol (PROVENTIL VENTOLIN) 2.5 mg /3 mL (0.083 %) nebulizer solution  2.5 mg by Nebulization route every eight (8) hours as needed.  1      Allergies   Allergen Reactions   ??? Prednisone Other (comments)     BLURRED VISION     Past Medical History   Diagnosis Date   ??? Anemia    ??? Anxiety    ??? Burning with urination    ??? Chronic mental illness    ??? Depression    ??? H/O seasonal allergies    ??? Headache 1997     migraines   ??? Incontinence in female    ??? Insomnia    ??? Premature ovarian failure      3-4 years ago      Past Surgical History   Procedure Laterality Date   ??? Hx ovarian cyst removal  1984   ??? Hx gyn     ??? Hx hernia repair  2016     hiatal hernia      Social History     Social History   ??? Marital status: SINGLE     Spouse name: N/A   ??? Number of children: N/A   ??? Years of education: N/A     Occupational History   ??? Not on file.     Social History Main  Topics   ??? Smoking status: Former Smoker   ??? Smokeless tobacco: Never Used   ??? Alcohol use 1.8 oz/week     3 Glasses of wine per week      Comment: socially   ??? Drug use: No   ??? Sexual activity: No     Other Topics Concern   ??? Not on file     Social History Narrative      Family History   Problem Relation Age of Onset   ??? Hypertension Mother    ??? Heart Disease Mother    ??? Alcohol abuse Father    ??? Hypertension Father    ??? Cancer Father    ??? Heart Disease Father    ??? Stroke Father    ??? Breast Cancer Sister    ??? Cancer Sister       Visit Vitals   ??? BP 116/70   ??? Pulse (!) 106   ??? Temp 98.2 ??F (36.8 ??C) (Oral)   ??? Resp 20   ??? Ht  (1.549 m)   ??? Wt 79.1 kg (174 lb 6.4 oz)   ??? SpO2 98%   ??? BMI 32.95 kg/m2        Review of Systems:   A comprehensive review of systems was negative except for that written in the HPI.      Neuro Exam:     Appearance:  The patient is well developed, well nourished, provides a coherent history and is in no acute distress.   Mental Status: Oriented to time, place and person. Mood and affect appropriate.   Cranial Nerves:   Intact visual fields. Fundi are benign. PERLA, EOM's  full, no nystagmus, no ptosis. Facial sensation is normal. Corneal reflexes are intact. Facial movement is symmetric. Hearing is normal bilaterally. Palate is midline with normal sternocleidomastoid and trapezius muscles are normal. Tongue is midline.   Motor:  5/5 strength in upper and lower proximal and distal muscles. Normal bulk and tone. No fasciculations.   Reflexes:   Deep tendon reflexes 2+/4 and symmetrical.   Sensory:   Normal to touch, pinprick and vibration.   Gait:  Normal gait.   Tremor:   No tremor noted.   Cerebellar:  No cerebellar signs present.   Neurovascular:  Normal heart sounds and regular rhythm, peripheral pulses intact, and no carotid bruits.            Assessment:   Chronic migraines with intermittent flares.  Will increase nortriptyline to 100 mg nightly and may take up to 3 weeks to notice an improvement in headache control.  With rescue can continue the Imitrex injectable with ibuprofen and gabapentin as needed.  Medrol Dosepak be considered as well for the future.  Phenergan 25 mg for nausea and vomiting.      Plan:   Revisit 6 months.  Signed by :  Chesley Noon, MD 02/22/2015

## 2015-02-22 NOTE — Progress Notes (Signed)
Reviewed record in preparation for visit and have necessary documentation  Pt did not bring medication to office visit for review  Information was given to pt on Advanced Directives, Living Will  opportunity was given for questions

## 2015-02-22 NOTE — Patient Instructions (Addendum)
PRESCRIPTION REFILL POLICY    [facility]   Statement to Patients  July 02, 2012      In an effort to ensure the large volume of patient prescription refills is processed in the most efficient and expeditious manner, we are asking our patients to assist [place] by calling your Pharmacy for all prescription refills, this will include also your  Mail Order Pharmacy. The pharmacy will contact our office electronically to continue the refill process.    Please do not wait until the last minute to call your pharmacy. We need at least 48 hours (2days) to fill prescriptions. We also encourage you to call your pharmacy before going to pick up your prescription to make sure it is ready.     With regard to controlled substance prescription refill requests (narcotic refills) that need to be picked up at our office, we ask your cooperation by providing [place] with at least 72 hours (3days) notice that you will need a refill.    We will not refill narcotic prescription refill requests after 4:00pm on any weekday, Monday through Thursday, or after 2:00pm on Fridays, or on the weekends.      We encourage everyone to explore another way of getting your prescription refill request processed using MyChart, our patient web portal through our electronic medical record system. MyChart is an efficient and effective way to communicate your medication request directly to the office and  downloadable as an app on your smart phone . MyChart also features a review functionality that allows you to view your medication list as well as leave messages for your physician. Are you ready to get connected? If so please review the attatched instructions or speak to any of our staff to get you set up right away!    Thank you so much for your cooperation. Should you have any questions please contact our Research officer, political party.    The Physicians and Staff,  [facility]              Post-Traumatic Stress Disorder (PTSD): Care Instructions  Your Care Instructions  Post-traumatic stress disorder (PTSD) is a mental condition that can result from being in or seeing a traumatic or terrifying event. These events can include combat, a terrorist attack, a natural disaster, a serious accident, an assault, or a rape. If you have PTSD, you may often relive the experience in nightmares or flashbacks. These are clear and frightening memories of the event. You may also have trouble sleeping.  PTSD affects people in very different ways. It can interfere with daily activities such as work or school, and it can make you withdraw from friends or loved ones.  Follow-up care is a key part of your treatment and safety. Be sure to make and go to all appointments, and call your doctor if you are having problems. It's also a good idea to know your test results and keep a list of the medicines you take.  How can you care for yourself at home?  ?? Take medicines exactly as directed. Call your doctor if you think you are having a problem with your medicine.  ?? Go to your counseling sessions and follow-up appointments.  ?? Recognize and accept your anxiety. Then, when you are in a situation that makes you anxious, say to yourself, "This is not an emergency. I feel uncomfortable, but I am not in danger. I can keep going even if I feel anxious."  ?? Be kind to your  body:  ?? Relieve tension with exercise or a massage.  ?? Get enough rest.  ?? Avoid alcohol, caffeine, nicotine, and illegal drugs. They can increase your anxiety level and cause sleep problems.  ?? Learn and do relaxation techniques. See below for more about these techniques.  ?? Engage your mind. Get out and do something you enjoy. Go to a funny movie, or take a walk or hike. Plan your day. Having too much or too little to do can make you anxious.  ?? Keep a record of your symptoms. Discuss your fears with a good friend or  family member, or join a support group for people with similar problems. Talking to others sometimes relieves stress.  ?? Get involved in social groups, or volunteer to help others. Being alone sometimes makes things seem worse than they are.  ?? Get at least 30 minutes of exercise on most days of the week. Walking is a good choice. You also may want to do other activities, such as running, swimming, cycling, or playing tennis or team sports.  ?? Keep the numbers for these national suicide hotlines: 1-800-273-TALK (438)117-0361(1-385 674 1010) and 1-800-SUICIDE 405 608 1606(1-707-690-7029). If you or someone you know talks about suicide or feeling hopeless, get help right away.  Relaxation techniques  Do relaxation exercises 10 to 20 minutes a day. You can play soothing, relaxing music while you do them, if you wish.  ?? Tell others in your house that you are going to do your relaxation exercises. Ask them not to disturb you.  ?? Find a comfortable place, away from all distractions and noise.  ?? Lie down on your back, or sit with your back straight.  ?? Focus on your breathing. Make it slow and steady.  ?? Breathe in through your nose. Breathe out through either your nose or mouth.  ?? Breathe deeply, filling up the area between your navel and your rib cage. Breathe so that your belly goes up and down.  ?? Do not hold your breath.  ?? Breathe like this for 5 to 10 minutes. Notice the feeling of calmness throughout your whole body.  As you continue to breathe slowly and deeply, relax by doing the following for another 5 to 10 minutes:  ?? Tighten and relax each muscle group in your body. You can begin at your toes and work your way up to your head.  ?? Imagine your muscle groups relaxing and becoming heavy.  ?? Empty your mind of all thoughts.  ?? Let yourself relax more and more deeply.  ?? Become aware of the state of calmness that surrounds you.  ?? When your relaxation time is over, you can bring yourself back to  alertness by moving your fingers and toes and then your hands and feet and then stretching and moving your entire body. Sometimes people fall asleep during relaxation, but they usually wake up shortly afterward.  ?? Always give yourself time to return to full alertness before you drive a car or do anything that might cause an accident if you are not fully alert. Never play a relaxation tape while you drive a car.  When should you call for help?  Call 911 anytime you think you may need emergency care. For example, call if:  ?? You feel you cannot stop from hurting yourself or someone else.  Watch closely for changes in your health, and be sure to contact your doctor if:  ?? Your PTSD symptoms are getting worse.  ?? You have new or  worsening symptoms of anxiety.  ?? You are not getting better as expected.  Where can you learn more?  Go to [employer]  Enter X299 in the search box to learn more about "Post-Traumatic Stress Disorder (PTSD): Care Instructions."  ?? 2006-2016 [employer]. Care instructions adapted under license by [employer] (which disclaims liability or warranty for this information). This care instruction is for use with your licensed healthcare professional. If you have questions about a medical condition or this instruction, always ask your healthcare professional. [employer] disclaims any warranty or liability for your use of this information.  Content Version: 11.0.578772; Current as of: February 20, 2014

## 2015-02-24 ENCOUNTER — Ambulatory Visit
Admit: 2015-02-24 | Discharge: 2015-02-24 | Payer: PRIVATE HEALTH INSURANCE | Attending: Internal Medicine | Primary: Internal Medicine

## 2015-02-24 DIAGNOSIS — D500 Iron deficiency anemia secondary to blood loss (chronic): Secondary | ICD-10-CM

## 2015-02-24 MED ORDER — FLUOXETINE 20 MG TAB
20 mg | ORAL_TABLET | Freq: Every day | ORAL | 1 refills | Status: AC
Start: 2015-02-24 — End: ?

## 2015-02-24 MED ORDER — GABAPENTIN 800 MG TAB
800 mg | ORAL_TABLET | Freq: Every evening | ORAL | 1 refills | Status: AC
Start: 2015-02-24 — End: ?

## 2015-02-24 NOTE — Progress Notes (Signed)
Chief Complaint   Patient presents with   ??? Medication Evaluation     Here to follow up on her status.  She had her two episodes of aspiration, not pneumonia, ended up having an esophageal wrap, and for reflux.  Has had no further pneumonias, no flares of her asthma.  Has been doing well there.  She has not had a flu vaccine.  She had an endoscopy recently that saw some ulcerations and some gastritis, and she was placed back on the proton pump inhibitor.  She has a history of iron deficiency anemia that was related to her GI problems.  She does not have periods.  She is still on iron, she is not sure she wants to.      She saw Dr. Peri Maris for migraines.  He has her on Nortriptyline up to 100 mg.  She has depression.  She was on Prozac previously.  She wondered if she could go back on a low dose.  She has not seen psychiatry.  She has tried to get an appointment with psychiatry, but really has not been successful.    Physical Examination:  Her blood pressure was normal.  Her lungs are clear.  Abdomen soft.      Impression:  We talked about migraines.  She cannot vomit now because of the esophageal wrap, but she does have anti-nausea drugs.      Plan:  I told her she could go back on a small dose SSRI, that is not contraindicated to take that with Nortriptyline.  I gave her Dr. Norlene Duel phone number because she really does need a good psychiatrist to follow her.  She is very treatment resistant to depression.  Will get lab work today.  If her labs are okay, she will be able to stop the iron.  She is not sure how long GI will leave her on the Protonix.  Hopefully 20 of Prozac will help with the Nortriptyline 100, suppress headaches.  I have also refilled her Gabapentin 800 mg at bedtime, which she uses for sleep and also uses for headache prophylaxis.  A flu shot today.  We do not have Prevnar.  I told her she could get one at the drug store.  She could get  Prevnar from pulmonary when she has a follow up with them.     Patient Active Problem List    Diagnosis   ??? SIRS (systemic inflammatory response syndrome) (HCC)   ??? ARDS (adult respiratory distress syndrome) (HCC)     Hey saw in hospital     ??? Mild persistent asthma without complication   ??? Recurrent major depressive disorder (HCC)   ??? Iron deficiency anemia     No capsule  Stay off nsaids     ??? Hiatal hernia with GERD and esophagitis     Past Surgical History   Procedure Laterality Date   ??? Hx ovarian cyst removal  1984   ??? Hx gyn     ??? Hx hernia repair  2016     hiatal hernia   ??? Hx endoscopy  2016     Vitals:    02/24/15 0848 02/24/15 0903   BP: (!) 120/98 120/80   Pulse: 100    Resp: 16    SpO2: 98%    Weight: 170 lb (77.1 kg)    Height:  (1.549 m)      Sandra White was seen today for medication evaluation.    Diagnoses and all orders for this  visit:    Iron deficiency anemia due to chronic blood loss  -     CBC WITH AUTOMATED DIFF  -     METABOLIC PANEL, COMPREHENSIVE  -     IRON PROFILE    Hiatal hernia with GERD and esophagitis  -     gabapentin (NEURONTIN) 800 mg tablet; Take 1 Tab by mouth nightly.    Mild persistent asthma without complication  -     gabapentin (NEURONTIN) 800 mg tablet; Take 1 Tab by mouth nightly.    Recurrent major depressive disorder, remission status unspecified (HCC)  -     REFERRAL TO PSYCHIATRY    Encounter for immunization  -     Influenza virus vaccine (QUADRIVALENT PRES FREE SYRINGE) IM 3 years and older  -     PR IMMUNIZ ADMIN,1 SINGLE/COMB VAC/TOXOID    Other orders  -     FLUoxetine (PROZAC) 20 mg tablet; Take 1 Tab by mouth daily.

## 2015-02-25 LAB — CBC WITH AUTOMATED DIFF
ABS. BASOPHILS: 0 10*3/uL (ref 0.0–0.2)
ABS. EOSINOPHILS: 0.3 10*3/uL (ref 0.0–0.4)
ABS. IMM. GRANS.: 0 10*3/uL (ref 0.0–0.1)
ABS. MONOCYTES: 0.3 10*3/uL (ref 0.1–0.9)
ABS. NEUTROPHILS: 1.9 10*3/uL (ref 1.4–7.0)
Abs Lymphocytes: 1.5 10*3/uL (ref 0.7–3.1)
BASOPHILS: 1 %
EOSINOPHILS: 8 %
HCT: 41.3 % (ref 34.0–46.6)
HGB: 14 g/dL (ref 11.1–15.9)
IMMATURE GRANULOCYTES: 0 %
Lymphocytes: 37 %
MCH: 30.3 pg (ref 26.6–33.0)
MCHC: 33.9 g/dL (ref 31.5–35.7)
MCV: 89 fL (ref 79–97)
MONOCYTES: 8 %
NEUTROPHILS: 46 %
PLATELET: 122 10*3/uL — ABNORMAL LOW (ref 150–379)
RBC: 4.62 x10E6/uL (ref 3.77–5.28)
RDW: 14.7 % (ref 12.3–15.4)
WBC: 4 10*3/uL (ref 3.4–10.8)

## 2015-02-25 LAB — METABOLIC PANEL, COMPREHENSIVE
A-G Ratio: 1.9 (ref 1.1–2.5)
ALT (SGPT): 62 IU/L — ABNORMAL HIGH (ref 0–32)
AST (SGOT): 57 IU/L — ABNORMAL HIGH (ref 0–40)
Albumin: 4.6 g/dL (ref 3.5–5.5)
Alk. phosphatase: 74 IU/L (ref 39–117)
BUN/Creatinine ratio: 13 (ref 9–23)
BUN: 11 mg/dL (ref 6–24)
Bilirubin, total: 0.3 mg/dL (ref 0.0–1.2)
CO2: 21 mmol/L (ref 18–29)
Calcium: 9.2 mg/dL (ref 8.7–10.2)
Chloride: 99 mmol/L (ref 97–106)
Creatinine: 0.83 mg/dL (ref 0.57–1.00)
GFR est AA: 98 mL/min/{1.73_m2} (ref >59)
GFR est non-AA: 85 mL/min/{1.73_m2} (ref >59)
GLOBULIN, TOTAL: 2.4 g/dL (ref 1.5–4.5)
Glucose: 94 mg/dL (ref 65–99)
Potassium: 4.5 mmol/L (ref 3.5–5.2)
Protein, total: 7 g/dL (ref 6.0–8.5)
Sodium: 140 mmol/L (ref 136–144)

## 2015-02-25 LAB — IRON PROFILE
Iron % saturation: 31 % (ref 15–55)
Iron: 125 ug/dL (ref 27–159)
TIBC: 402 ug/dL (ref 250–450)
UIBC: 277 ug/dL (ref 131–425)

## 2015-02-28 NOTE — Progress Notes (Signed)
Add hep c antibody and ferritin levels  For abnormal liver enzymes

## 2015-03-01 ENCOUNTER — Encounter: Attending: Internal Medicine | Primary: Internal Medicine

## 2015-03-01 NOTE — Progress Notes (Signed)
Called [facility] to add on an Hep C Antibody and Ferritin levels as requested per Dr Janace ArisBrengel as requested.

## 2015-03-02 ENCOUNTER — Encounter

## 2015-03-02 LAB — SPECIMEN STATUS REPORT

## 2015-03-02 LAB — HEPATITIS C AB: Hep C Virus Ab: 0.1 s/co ratio (ref 0.0–0.9)

## 2015-03-02 LAB — FERRITIN: Ferritin: 108 ng/mL (ref 15–150)

## 2015-03-02 LAB — HEPATITIS C ANTIBODY: HCV Ab: 0.1 s/co ratio (ref 0.0–0.9)

## 2015-03-23 MED ORDER — NORTRIPTYLINE 25 MG CAP
25 mg | ORAL_CAPSULE | ORAL | 5 refills | Status: AC
Start: 2015-03-23 — End: ?

## 2015-08-23 ENCOUNTER — Encounter: Attending: Specialist | Primary: Internal Medicine

## 2015-11-15 MED ORDER — SUMATRIPTAN 100 MG TAB
100 mg | ORAL_TABLET | ORAL | 0 refills | Status: AC
Start: 2015-11-15 — End: ?

## 2015-11-15 NOTE — Telephone Encounter (Signed)
Last refill til RV. [email or web address]

## 2016-03-29 DIAGNOSIS — J45909 Unspecified asthma, uncomplicated: Secondary | ICD-10-CM | POA: Diagnosis not present

## 2016-03-29 DIAGNOSIS — G43909 Migraine, unspecified, not intractable, without status migrainosus: Secondary | ICD-10-CM | POA: Diagnosis not present

## 2016-05-02 DIAGNOSIS — R5382 Chronic fatigue, unspecified: Secondary | ICD-10-CM | POA: Diagnosis not present

## 2016-05-02 DIAGNOSIS — F330 Major depressive disorder, recurrent, mild: Secondary | ICD-10-CM | POA: Diagnosis not present

## 2016-05-02 DIAGNOSIS — D509 Iron deficiency anemia, unspecified: Secondary | ICD-10-CM | POA: Diagnosis not present

## 2016-05-02 DIAGNOSIS — Z13220 Encounter for screening for lipoid disorders: Secondary | ICD-10-CM | POA: Diagnosis not present

## 2016-05-02 DIAGNOSIS — E559 Vitamin D deficiency, unspecified: Secondary | ICD-10-CM | POA: Diagnosis not present

## 2016-05-02 DIAGNOSIS — G43709 Chronic migraine without aura, not intractable, without status migrainosus: Secondary | ICD-10-CM | POA: Diagnosis not present

## 2016-05-02 DIAGNOSIS — J4540 Moderate persistent asthma, uncomplicated: Secondary | ICD-10-CM | POA: Diagnosis not present

## 2016-05-16 DIAGNOSIS — G43709 Chronic migraine without aura, not intractable, without status migrainosus: Secondary | ICD-10-CM | POA: Diagnosis not present

## 2016-05-16 DIAGNOSIS — H9313 Tinnitus, bilateral: Secondary | ICD-10-CM | POA: Diagnosis not present

## 2016-06-14 DIAGNOSIS — F4323 Adjustment disorder with mixed anxiety and depressed mood: Secondary | ICD-10-CM | POA: Diagnosis not present

## 2016-06-19 DIAGNOSIS — G43109 Migraine with aura, not intractable, without status migrainosus: Secondary | ICD-10-CM | POA: Insufficient documentation

## 2016-06-19 DIAGNOSIS — H8102 Meniere's disease, left ear: Secondary | ICD-10-CM | POA: Diagnosis not present

## 2016-06-19 DIAGNOSIS — H9313 Tinnitus, bilateral: Secondary | ICD-10-CM | POA: Diagnosis not present

## 2016-06-19 DIAGNOSIS — H8109 Meniere's disease, unspecified ear: Secondary | ICD-10-CM | POA: Insufficient documentation

## 2016-06-19 DIAGNOSIS — H8112 Benign paroxysmal vertigo, left ear: Secondary | ICD-10-CM | POA: Diagnosis not present

## 2016-06-19 DIAGNOSIS — H6122 Impacted cerumen, left ear: Secondary | ICD-10-CM | POA: Diagnosis not present

## 2016-06-20 DIAGNOSIS — F4322 Adjustment disorder with anxiety: Secondary | ICD-10-CM | POA: Diagnosis not present

## 2016-06-27 ENCOUNTER — Ambulatory Visit (INDEPENDENT_AMBULATORY_CARE_PROVIDER_SITE_OTHER): Payer: BLUE CROSS/BLUE SHIELD | Admitting: Neurology

## 2016-06-27 ENCOUNTER — Encounter: Payer: Self-pay | Admitting: Neurology

## 2016-06-27 DIAGNOSIS — IMO0002 Reserved for concepts with insufficient information to code with codable children: Secondary | ICD-10-CM

## 2016-06-27 DIAGNOSIS — F4323 Adjustment disorder with mixed anxiety and depressed mood: Secondary | ICD-10-CM | POA: Diagnosis not present

## 2016-06-27 DIAGNOSIS — R202 Paresthesia of skin: Secondary | ICD-10-CM

## 2016-06-27 DIAGNOSIS — G43709 Chronic migraine without aura, not intractable, without status migrainosus: Secondary | ICD-10-CM

## 2016-06-27 DIAGNOSIS — R42 Dizziness and giddiness: Secondary | ICD-10-CM

## 2016-06-27 MED ORDER — TOPIRAMATE 100 MG PO TABS
100.0000 mg | ORAL_TABLET | Freq: Two times a day (BID) | ORAL | 11 refills | Status: DC
Start: 2016-06-27 — End: 2020-07-13

## 2016-06-27 NOTE — Progress Notes (Signed)
PATIENT: Maria KaufmanRobin Burnett DOB: March 09, 1971  Chief Complaint  Patient presents with  . Migraine    Reports having two migraine days in the last two weeks, since starting propranolol.  Prior to starting this medication, she was having 3-4 per week.   . Dizziness    Orhtostatic Vitals: Lying: 136/93, 75, Sitting: 116/88, 76, Standing: 121/86, 77, Standing x 3 minutes: 126/93, 78.  She has been having dizziness with and without migraines.  She is being evaluated for Meniere's Disease by ENT.  . Otolaryngology    Maria ColonelJefry Rosen, Maria Burnett - referring Maria Burnett  . PCP    Maria Mylararol Webb, Maria Burnett     HISTORICAL  Maria Burnett is a 46 years old right-handed female , seen in refer by  per year and he Dr. Serena ColonelJefry Burnett, for evaluation of chronic migraine, intermittent vertigo, her primary care physician is Dr. Shirlean Mylararol Burnett, initial evaluation was on June 27 2016.  She had a history of chronic migraine her twenties, her typical migraine are lateralized severe pounding headache with associated light noise sensitivity, nauseous, throwing up, it happened occasionally, she was evaluated with MRI more than 20 years ago was reported normal.  She noticed increased migraine headaches since September 2016, become more severe, more frequent, she also has intense orders, arms goes numb, blurry visions during and before the onset of the headaches. Her migraines usually responding well to Imitrex injection.   In January 2018,There was significant change, instead of her typical right retro-orbital area migraine headaches, now her headaches are often at the left frontal region, pressure, in addition, she had daily intermittent vertigo, loud ringing sound in her ear, mild decreased hearing at the left ear, unsteady gait, the vertigo can last about 30 minutes to a few hours, there was no significant headache with it.  There was a consideration of possible Mnire's disease, her maternal grandmother was diagnosed with Mnire's disease in the  past.  Recent ENT tests showed no significant hearing loss, she was given the prescription of propanolol, which has helped her headache but not dizziness.   REVIEW OF SYSTEMS: Full 14 system review of systems performed and notable only for fatigue, hearing loss, ringing ears, spinning sensation, feeling hot, flushing, allergy, headaches, weakness, dizziness, insomnia, depression, anxiety, not enough sleep, decreased energy, racing thoughts.   ALLERGIES: Allergies  Allergen Reactions  . Prednisone Other (See Comments)    Blurred vision    HOME MEDICATIONS: Current Outpatient Prescriptions  Medication Sig Dispense Refill  . 5-Hydroxytryptophan (5-HTP) 100 MG CAPS Take 100 mg by mouth daily.    . Albuterol Sulfate (VENTOLIN HFA IN) Inhale into the lungs as needed.    . ASHWAGANDHA PO Take by mouth daily.    . B Complex Vitamins (VITAMIN B COMPLEX PO) Take by mouth.    Marland Kitchen. BLACK COHOSH PO Take by mouth daily.    . Budesonide-Formoterol Fumarate (SYMBICORT IN) Inhale into the lungs.    . clonazePAM (KLONOPIN) 0.5 MG tablet Take 0.5 mg by mouth 2 (two) times daily as needed.  0  . gabapentin (NEURONTIN) 300 MG capsule Take 900 mg by mouth at bedtime.  11  . Multiple Vitamins-Minerals (MULTIVITAMIN PO) Take by mouth daily.    . ondansetron (ZOFRAN) 8 MG tablet Take 8 mg by mouth every 8 (eight) hours as needed.    . promethazine (PHENERGAN) 25 MG tablet Take 25 mg by mouth every 4 (four) hours as needed.    . propranolol (INDERAL) 20 MG tablet Take 20  mg by mouth 2 (two) times daily.    . SUMAtriptan (IMITREX) 100 MG tablet TAKE 1 TAB BY MOUTH AT THE ONSET OF HEADACHE. MAY REPEAT IN 2 HOURS IF NEEDED. MAX OF 200MG  PER DAY    . SUMAtriptan Succinate (ZEMBRACE SYMTOUCH) 3 MG/0.5ML SOAJ Inject into the skin as needed.    . TURMERIC PO Take by mouth daily.     No current facility-administered medications for this visit.     PAST MEDICAL HISTORY: Past Medical History:  Diagnosis Date  .  Anxiety   . Asthma   . Depression   . Migraine   . Perimenopause    "premature ovarian failure"  . PTSD (post-traumatic stress disorder)   . Vertigo     PAST SURGICAL HISTORY: Past Surgical History:  Procedure Laterality Date  . HIATAL HERNIA REPAIR    . OVARIAN CYST REMOVAL      FAMILY HISTORY: Family History  Problem Relation Age of Onset  . Hypertension Mother   . Lung disease Father   . Alcoholism Father   . Hyperlipidemia Father   . Breast cancer Sister     SOCIAL HISTORY:  Social History   Social History  . Marital status: Single    Spouse name: N/A  . Number of children: 0  . Years of education: Bachelors   Occupational History  . Self-employed - rental/sales    Social History Main Topics  . Smoking status: Former Games developer  . Smokeless tobacco: Never Used     Comment: Not smoked since her 70s - only 2 cigarettes per day for about one year.  . Alcohol use Yes     Comment: Social - 1-2 glasses of wine   . Drug use: No  . Sexual activity: Not on file   Other Topics Concern  . Not on file   Social History Narrative   Lives at home alone.   Right-handed.   Rare caffeine use.     PHYSICAL EXAM   Vitals:   06/27/16 1251  BP: (!) 136/93  Pulse: 75  Weight: 167 lb (75.8 kg)  Height: 5\' 2"  (1.575 m)    Not recorded      Body mass index is 30.54 kg/m.  PHYSICAL EXAMNIATION:  Gen: NAD, conversant, well nourised, obese, well groomed                     Cardiovascular: Regular rate rhythm, no peripheral edema, warm, nontender. Eyes: Conjunctivae clear without exudates or hemorrhage Neck: Supple, no carotid bruits. Pulmonary: Clear to auscultation bilaterally   NEUROLOGICAL EXAM:  MENTAL STATUS: Speech:    Speech is normal; fluent and spontaneous with normal comprehension.  Cognition:     Orientation to ti chronic migraine headaches chronic migrainesme, place and person     Normal recent and remote memory     Normal Attention span and  concentration     Normal Language, naming, repeating,spontaneous speech     Fund of knowledge   CRANIAL NERVES: CN II: Visual fields are full to confrontation. Fundoscopic exam is normal with sharp discs and no vascular changes. Pupils are round equal and briskly reactive to light. CN III, IV, VI: extraocular movement are normal. No ptosis. CN V: Facial sensation is intact to pinprick in all 3 divisions bilaterally. Corneal responses are intact.  CN VII: Face is symmetric with normal eye closure and smile. CN VIII: Hearing is normal to rubbing fingers CN IX, X: Palate elevates symmetrically. Phonation is normal.  CN XI: Head turning and shoulder shrug are intact CN XII: Tongue is midline with normal movements and no atrophy.  MOTOR: There is no pronator drift of out-stretched arms. Muscle bulk and tone are normal. Muscle strength is normal.  REFLEXES: Reflexes are 2+ and symmetric at the biceps, triceps, knees, and ankles. Plantar responses are flexor.  SENSORY: Intact to light touch, pinprick, positional sensation and vibratory sensation are intact in fingers and toes.  COORDINATION: Rapid alternating movements and fine finger movements are intact. There is no dysmetria on finger-to-nose and heel-knee-shin.    GAIT/STANCE: Posture is normal. Gait is steady with normal steps, base, arm swing, and turning. Heel and toe walking are normal. Tandem gait is normal.  Romberg is absent.   DIAGNOSTIC DATA (LABS, IMAGING, TESTING) - I reviewed patient records, labs, notes, testing and imaging myself where available.   ASSESSMENT AND PLAN  Loistine Eberlin is a 46 y.o. female    Chronic migraine   New-onset vertigo, worsening left ear tinnitus, hearing loss  Possibility including migraine variants, needs to rule out central nervous system space-occupying lesion  Proceed with MRI of the brain with without contrast emphasize on internal auditory canal  Topamax 100 mg twice a day  Imitrex  as needed    Levert Feinstein, M.D. Ph.D.  [facility] [address] [place], [place] [address] Ph: [phone] Fax: [phone]  CC: Maria Burnett, Maria Hyman Hopes, Maria Burnett

## 2016-07-04 DIAGNOSIS — F4323 Adjustment disorder with mixed anxiety and depressed mood: Secondary | ICD-10-CM | POA: Diagnosis not present

## 2016-07-11 DIAGNOSIS — F4323 Adjustment disorder with mixed anxiety and depressed mood: Secondary | ICD-10-CM | POA: Diagnosis not present

## 2016-07-17 ENCOUNTER — Ambulatory Visit
Admission: RE | Admit: 2016-07-17 | Discharge: 2016-07-17 | Disposition: A | Discharge status: Home / Self Care | Payer: BLUE CROSS/BLUE SHIELD | Source: Ambulatory Visit | Attending: Neurology | Admitting: Neurology

## 2016-07-17 DIAGNOSIS — G43709 Chronic migraine without aura, not intractable, without status migrainosus: Secondary | ICD-10-CM

## 2016-07-17 DIAGNOSIS — R202 Paresthesia of skin: Secondary | ICD-10-CM

## 2016-07-17 DIAGNOSIS — G43909 Migraine, unspecified, not intractable, without status migrainosus: Secondary | ICD-10-CM | POA: Diagnosis not present

## 2016-07-17 DIAGNOSIS — R42 Dizziness and giddiness: Secondary | ICD-10-CM

## 2016-07-17 DIAGNOSIS — IMO0002 Reserved for concepts with insufficient information to code with codable children: Secondary | ICD-10-CM

## 2016-07-17 MED ORDER — GADOBENATE DIMEGLUMINE 529 MG/ML IV SOLN
14.0000 mL | Freq: Once | INTRAVENOUS | Status: AC | PRN
  Administered 2016-07-17: 14 mL via INTRAVENOUS

## 2016-07-18 DIAGNOSIS — F4323 Adjustment disorder with mixed anxiety and depressed mood: Secondary | ICD-10-CM | POA: Diagnosis not present

## 2016-07-19 DIAGNOSIS — F5101 Primary insomnia: Secondary | ICD-10-CM | POA: Diagnosis not present

## 2016-07-19 DIAGNOSIS — I10 Essential (primary) hypertension: Secondary | ICD-10-CM | POA: Diagnosis not present

## 2016-07-19 DIAGNOSIS — F418 Other specified anxiety disorders: Secondary | ICD-10-CM | POA: Diagnosis not present

## 2016-07-24 DIAGNOSIS — H9313 Tinnitus, bilateral: Secondary | ICD-10-CM | POA: Diagnosis not present

## 2016-07-24 DIAGNOSIS — H8102 Meniere's disease, left ear: Secondary | ICD-10-CM | POA: Diagnosis not present

## 2016-07-24 DIAGNOSIS — G43109 Migraine with aura, not intractable, without status migrainosus: Secondary | ICD-10-CM | POA: Diagnosis not present

## 2016-07-25 DIAGNOSIS — F4323 Adjustment disorder with mixed anxiety and depressed mood: Secondary | ICD-10-CM | POA: Diagnosis not present

## 2016-07-26 ENCOUNTER — Ambulatory Visit (INDEPENDENT_AMBULATORY_CARE_PROVIDER_SITE_OTHER): Payer: BLUE CROSS/BLUE SHIELD | Admitting: Neurology

## 2016-07-26 ENCOUNTER — Encounter: Payer: Self-pay | Admitting: Neurology

## 2016-07-26 VITALS — BP 148/96 | HR 114 | Ht 62.0 in | Wt 165.0 lb

## 2016-07-26 DIAGNOSIS — R42 Dizziness and giddiness: Secondary | ICD-10-CM

## 2016-07-26 DIAGNOSIS — R202 Paresthesia of skin: Secondary | ICD-10-CM | POA: Diagnosis not present

## 2016-07-26 DIAGNOSIS — G43709 Chronic migraine without aura, not intractable, without status migrainosus: Secondary | ICD-10-CM | POA: Diagnosis not present

## 2016-07-26 DIAGNOSIS — IMO0002 Reserved for concepts with insufficient information to code with codable children: Secondary | ICD-10-CM

## 2016-07-26 NOTE — Progress Notes (Addendum)
PATIENT: Maria Burnett DOB: Oct 26, 1970  Chief Complaint  Patient presents with  . Migraine    She is here with her uncle, Annette Stable.  She would like to review her MRI.  She is tolerating the Topamax , BID dosing and feels it has improved her migraines.     HISTORICAL  Maria Burnett is a 46 years old right-handed female , seen in refer by  per year and he Dr. Serena Colonel, for evaluation of chronic migraine, intermittent vertigo, her primary care physician is Dr. Shirlean Mylar, initial evaluation was on June 27 2016.  She had a history of chronic migraine her twenties, her typical migraine are lateralized severe pounding headache with associated light noise sensitivity, nauseous, throwing up, it happened occasionally, she was evaluated with MRI more than 20 years ago was reported normal.  She noticed increased migraine headaches since September 2016, become more severe, more frequent, she also has intense orders, arms goes numb, blurry visions during and before the onset of the headaches. Her migraines usually responding well to Imitrex injection.   In January 2018,There was significant change, instead of her typical right retro-orbital area migraine headaches, now her headaches are often at the left frontal region, pressure, in addition, she had daily intermittent vertigo, loud ringing sound in her ear, mild decreased hearing at the left ear, unsteady gait, the vertigo can last about 30 minutes to a few hours, there was no significant headache with it.  There was a consideration of possible Mnire's disease, her maternal grandmother was diagnosed with Mnire's disease in the past.  Recent ENT tests showed no significant hearing loss, she was given the prescription of propanolol, which has helped her headache but not dizziness.  UPDATE April 25 th 2018: With the locking she brought in for her migraines/vertigo hearing, tinnitus history, symptoms since this started around December 2017,  initially she was bothered by vertigo that is often associated with her migraine, nausea, as time progressed, vertigo and tinnitus dominant the picture, by February 2018 she also noticed increased intermittent left-sided hearing loss, which has been persistent, now she rarely has significant migraine headache, but continue complains almost constant severe vertigo, tinnitus, left-sided hearing loss, there was a concern of Mnire's disease by ENT  She is able to tolerate topamax  bid, she has much less migraine, it also has helped her menopause symptoms, her vertigo lasted 20 minutes to 4 hours, fullness in both ears, popping sound in her left ear, loose hearing for 4-8 hours, then her hearing comes back.  During the episodes, she has severe nauseous, vomitting, she would take phenergan or zofran as needed. Clonozepam 0.5mg  prn was helpful.   We have personally reviewed MRI brain on April 18th 2018, that was normal.   She is also taking gabapentin 300 mg 3 tablets every night for insomnia,   REVIEW OF SYSTEMS: Full 14 system review of systems performed and notable only for fatigue, hearing loss, ringing ears I itching, redness light sensitivity, blurry vision, constipation, nausea or vomiting, insomnia, dizziness, headache numbness depression anxiety  ALLERGIES: Allergies  Allergen Reactions  . Prednisone Other (See Comments)    Blurred vision    HOME MEDICATIONS: Current Outpatient Prescriptions  Medication Sig Dispense Refill  . Albuterol Sulfate (VENTOLIN HFA IN) Inhale into the lungs as needed.    . B Complex Vitamins (VITAMIN B COMPLEX PO) Take by mouth.    . Budesonide-Formoterol Fumarate (SYMBICORT IN) Inhale into the lungs.    . clonazePAM (KLONOPIN)  0.5 MG tablet Take 0.5 mg by mouth 2 (two) times daily as needed.  0  . gabapentin (NEURONTIN) 300 MG capsule Take 900 mg by mouth at bedtime.  11  . hydrochlorothiazide (HYDRODIURIL) 25 MG tablet Take 25 mg by mouth daily.    .  Multiple Vitamins-Minerals (MULTIVITAMIN PO) Take by mouth daily.    . ondansetron (ZOFRAN) 8 MG tablet Take 8 mg by mouth every 8 (eight) hours as needed for nausea or vomiting.    . promethazine (PHENERGAN) 25 MG tablet Take 25 mg by mouth every 4 (four) hours as needed.    . SUMAtriptan (IMITREX) 100 MG tablet TAKE 1 TAB BY MOUTH AT THE ONSET OF HEADACHE. MAY REPEAT IN 2 HOURS IF NEEDED. MAX OF  PER DAY    . SUMAtriptan Succinate (ZEMBRACE SYMTOUCH) 3 MG/0.5ML SOAJ Inject into the skin as needed.    . topiramate (TOPAMAX) 100 MG tablet Take 1 tablet (100 mg total) by mouth 2 (two) times daily. 60 tablet 11   No current facility-administered medications for this visit.     PAST MEDICAL HISTORY: Past Medical History:  Diagnosis Date  . Anxiety   . Asthma   . Depression   . Migraine   . Perimenopause    "premature ovarian failure"  . PTSD (post-traumatic stress disorder)   . Vertigo     PAST SURGICAL HISTORY: Past Surgical History:  Procedure Laterality Date  . HIATAL HERNIA REPAIR    . OVARIAN CYST REMOVAL      FAMILY HISTORY: Family History  Problem Relation Age of Onset  . Hypertension Mother   . Lung disease Father   . Alcoholism Father   . Hyperlipidemia Father   . Breast cancer Sister     SOCIAL HISTORY:  Social History   Social History  . Marital status: Single    Spouse name: N/A  . Number of children: 0  . Years of education: Bachelors   Occupational History  . Self-employed - rental/sales    Social History Main Topics  . Smoking status: Former Games developer  . Smokeless tobacco: Never Used     Comment: Not smoked since her 46s - only 2 cigarettes per day for about one year.  . Alcohol use Yes     Comment: Social - 1-2 glasses of wine   . Drug use: No  . Sexual activity: Not on file   Other Topics Concern  . Not on file   Social History Narrative   Lives at home alone.   Right-handed.   Rare caffeine use.     PHYSICAL EXAM   Vitals:    07/26/16 1251  BP: (!) 148/96  Pulse: (!) 114  Weight: 165 lb (74.8 kg)  Height:  (1.575 m)    Not recorded      Body mass index is 30.18 kg/m.  PHYSICAL EXAMNIATION:  Gen: NAD, conversant, well nourised, obese, well groomed                     Cardiovascular: Regular rate rhythm, no peripheral edema, warm, nontender. Eyes: Conjunctivae clear without exudates or hemorrhage Neck: Supple, no carotid bruits. Pulmonary: Clear to auscultation bilaterally   NEUROLOGICAL EXAM:  MENTAL STATUS: Speech:    Speech is normal; fluent and spontaneous with normal comprehension.  Cognition:     Orientation to ti chronic migraine headaches chronic migrainesme, place and person     Normal recent and remote memory     Normal Attention  span and concentration     Normal Language, naming, repeating,spontaneous speech     Fund of knowledge   CRANIAL NERVES: CN II: Visual fields are full to confrontation. Fundoscopic exam is normal with sharp discs and no vascular changes. Pupils are round equal and briskly reactive to light. CN III, IV, VI: extraocular movement are normal. No ptosis. CN V: Facial sensation is intact to pinprick in all 3 divisions bilaterally. Corneal responses are intact.  CN VII: Face is symmetric with normal eye closure and smile. CN VIII: Hearing is normal to rubbing fingers CN IX, X: Palate elevates symmetrically. Phonation is normal. CN XI: Head turning and shoulder shrug are intact CN XII: Tongue is midline with normal movements and no atrophy.  MOTOR: There is no pronator drift of out-stretched arms. Muscle bulk and tone are normal. Muscle strength is normal.  REFLEXES: Reflexes are 2+ and symmetric at the biceps, triceps, knees, and ankles. Plantar responses are flexor.  SENSORY: Intact to light touch, pinprick, positional sensation and vibratory sensation are intact in fingers and toes.  COORDINATION: Rapid alternating movements and fine finger movements  are intact. There is no dysmetria on finger-to-nose and heel-knee-shin.    GAIT/STANCE: Posture is normal. Gait is steady with normal steps, base, arm swing, and turning. Heel and toe walking are normal. Tandem gait is normal.  Romberg is absent.   DIAGNOSTIC DATA (LABS, IMAGING, TESTING) - I reviewed patient records, labs, notes, testing and imaging myself where available.   ASSESSMENT AND PLAN  Monzerat Handler is a 46 y.o. female    Chronic migraine   New-onset vertigo, worsening left ear tinnitus, hearing loss  Possibility including migraine variants, versus Mnire's disease  MRI of the brain with without contrast was normal in April 2018, no evidence of internal acoustic canal structures relation  Continue Topamax 100 mg twice a day  Hydrochlorothiazide  Stop gabapentin 900 mg to decrease the potential side effect  Follow up with ENT  Levert Feinstein M.D. Ph.D.  [facility] [address] [place], [place] [address] Ph: [phone] Fax: [phone]  CC: Serena Colonel, MDCarol Hyman Hopes, MD

## 2016-08-01 DIAGNOSIS — F4323 Adjustment disorder with mixed anxiety and depressed mood: Secondary | ICD-10-CM | POA: Diagnosis not present

## 2016-08-08 DIAGNOSIS — F4323 Adjustment disorder with mixed anxiety and depressed mood: Secondary | ICD-10-CM | POA: Diagnosis not present

## 2016-08-08 DIAGNOSIS — H04123 Dry eye syndrome of bilateral lacrimal glands: Secondary | ICD-10-CM | POA: Diagnosis not present

## 2016-08-15 DIAGNOSIS — F4323 Adjustment disorder with mixed anxiety and depressed mood: Secondary | ICD-10-CM | POA: Diagnosis not present

## 2016-08-21 DIAGNOSIS — F4323 Adjustment disorder with mixed anxiety and depressed mood: Secondary | ICD-10-CM | POA: Diagnosis not present

## 2016-08-22 DIAGNOSIS — H40013 Open angle with borderline findings, low risk, bilateral: Secondary | ICD-10-CM | POA: Diagnosis not present

## 2016-08-24 DIAGNOSIS — H8109 Meniere's disease, unspecified ear: Secondary | ICD-10-CM | POA: Diagnosis not present

## 2016-08-29 DIAGNOSIS — F4323 Adjustment disorder with mixed anxiety and depressed mood: Secondary | ICD-10-CM | POA: Diagnosis not present

## 2016-08-30 DIAGNOSIS — F341 Dysthymic disorder: Secondary | ICD-10-CM | POA: Diagnosis not present

## 2016-09-04 DIAGNOSIS — H8109 Meniere's disease, unspecified ear: Secondary | ICD-10-CM | POA: Diagnosis not present

## 2016-09-04 DIAGNOSIS — I10 Essential (primary) hypertension: Secondary | ICD-10-CM | POA: Diagnosis not present

## 2016-09-04 DIAGNOSIS — E559 Vitamin D deficiency, unspecified: Secondary | ICD-10-CM | POA: Diagnosis not present

## 2016-09-04 DIAGNOSIS — D509 Iron deficiency anemia, unspecified: Secondary | ICD-10-CM | POA: Diagnosis not present

## 2016-09-05 DIAGNOSIS — F4323 Adjustment disorder with mixed anxiety and depressed mood: Secondary | ICD-10-CM | POA: Diagnosis not present

## 2016-09-11 DIAGNOSIS — I10 Essential (primary) hypertension: Secondary | ICD-10-CM | POA: Diagnosis not present

## 2016-09-12 DIAGNOSIS — F4323 Adjustment disorder with mixed anxiety and depressed mood: Secondary | ICD-10-CM | POA: Diagnosis not present

## 2016-09-19 DIAGNOSIS — F4323 Adjustment disorder with mixed anxiety and depressed mood: Secondary | ICD-10-CM | POA: Diagnosis not present

## 2016-09-25 DIAGNOSIS — E876 Hypokalemia: Secondary | ICD-10-CM | POA: Diagnosis not present

## 2016-09-26 DIAGNOSIS — H8109 Meniere's disease, unspecified ear: Secondary | ICD-10-CM | POA: Diagnosis not present

## 2016-09-26 DIAGNOSIS — F4323 Adjustment disorder with mixed anxiety and depressed mood: Secondary | ICD-10-CM | POA: Diagnosis not present

## 2016-10-03 DIAGNOSIS — F4323 Adjustment disorder with mixed anxiety and depressed mood: Secondary | ICD-10-CM | POA: Diagnosis not present

## 2016-10-06 DIAGNOSIS — E876 Hypokalemia: Secondary | ICD-10-CM | POA: Diagnosis not present

## 2016-10-09 DIAGNOSIS — H04123 Dry eye syndrome of bilateral lacrimal glands: Secondary | ICD-10-CM | POA: Diagnosis not present

## 2016-10-10 DIAGNOSIS — F4322 Adjustment disorder with anxiety: Secondary | ICD-10-CM | POA: Diagnosis not present

## 2016-10-11 DIAGNOSIS — F411 Generalized anxiety disorder: Secondary | ICD-10-CM | POA: Diagnosis not present

## 2016-10-11 DIAGNOSIS — F331 Major depressive disorder, recurrent, moderate: Secondary | ICD-10-CM | POA: Diagnosis not present

## 2016-10-17 DIAGNOSIS — F4322 Adjustment disorder with anxiety: Secondary | ICD-10-CM | POA: Diagnosis not present

## 2016-10-18 DIAGNOSIS — D692 Other nonthrombocytopenic purpura: Secondary | ICD-10-CM | POA: Diagnosis not present

## 2016-10-18 DIAGNOSIS — D2372 Other benign neoplasm of skin of left lower limb, including hip: Secondary | ICD-10-CM | POA: Diagnosis not present

## 2016-10-18 DIAGNOSIS — L280 Lichen simplex chronicus: Secondary | ICD-10-CM | POA: Diagnosis not present

## 2016-10-18 DIAGNOSIS — L80 Vitiligo: Secondary | ICD-10-CM | POA: Diagnosis not present

## 2016-10-24 DIAGNOSIS — F4322 Adjustment disorder with anxiety: Secondary | ICD-10-CM | POA: Diagnosis not present

## 2016-10-27 DIAGNOSIS — E876 Hypokalemia: Secondary | ICD-10-CM | POA: Diagnosis not present

## 2016-11-02 DIAGNOSIS — F4323 Adjustment disorder with mixed anxiety and depressed mood: Secondary | ICD-10-CM | POA: Diagnosis not present

## 2016-11-07 DIAGNOSIS — F4323 Adjustment disorder with mixed anxiety and depressed mood: Secondary | ICD-10-CM | POA: Diagnosis not present

## 2016-11-14 DIAGNOSIS — F4323 Adjustment disorder with mixed anxiety and depressed mood: Secondary | ICD-10-CM | POA: Diagnosis not present

## 2016-11-21 DIAGNOSIS — F4323 Adjustment disorder with mixed anxiety and depressed mood: Secondary | ICD-10-CM | POA: Diagnosis not present

## 2016-11-28 DIAGNOSIS — F4323 Adjustment disorder with mixed anxiety and depressed mood: Secondary | ICD-10-CM | POA: Diagnosis not present

## 2016-12-05 DIAGNOSIS — G4700 Insomnia, unspecified: Secondary | ICD-10-CM | POA: Diagnosis not present

## 2016-12-05 DIAGNOSIS — F4323 Adjustment disorder with mixed anxiety and depressed mood: Secondary | ICD-10-CM | POA: Diagnosis not present

## 2016-12-05 DIAGNOSIS — F329 Major depressive disorder, single episode, unspecified: Secondary | ICD-10-CM | POA: Diagnosis not present

## 2016-12-05 DIAGNOSIS — F419 Anxiety disorder, unspecified: Secondary | ICD-10-CM | POA: Diagnosis not present

## 2016-12-05 DIAGNOSIS — E559 Vitamin D deficiency, unspecified: Secondary | ICD-10-CM | POA: Diagnosis not present

## 2016-12-05 DIAGNOSIS — H8109 Meniere's disease, unspecified ear: Secondary | ICD-10-CM | POA: Diagnosis not present

## 2016-12-13 DIAGNOSIS — F332 Major depressive disorder, recurrent severe without psychotic features: Secondary | ICD-10-CM | POA: Diagnosis not present

## 2016-12-19 DIAGNOSIS — F4323 Adjustment disorder with mixed anxiety and depressed mood: Secondary | ICD-10-CM | POA: Diagnosis not present

## 2016-12-26 DIAGNOSIS — F4323 Adjustment disorder with mixed anxiety and depressed mood: Secondary | ICD-10-CM | POA: Diagnosis not present

## 2017-01-01 DIAGNOSIS — K5900 Constipation, unspecified: Secondary | ICD-10-CM | POA: Diagnosis not present

## 2017-01-01 DIAGNOSIS — J45909 Unspecified asthma, uncomplicated: Secondary | ICD-10-CM | POA: Diagnosis not present

## 2017-01-01 DIAGNOSIS — F419 Anxiety disorder, unspecified: Secondary | ICD-10-CM | POA: Diagnosis not present

## 2017-01-01 DIAGNOSIS — G4700 Insomnia, unspecified: Secondary | ICD-10-CM | POA: Diagnosis not present

## 2017-01-02 DIAGNOSIS — F4323 Adjustment disorder with mixed anxiety and depressed mood: Secondary | ICD-10-CM | POA: Diagnosis not present

## 2017-01-05 DIAGNOSIS — F332 Major depressive disorder, recurrent severe without psychotic features: Secondary | ICD-10-CM | POA: Diagnosis not present

## 2017-01-23 DIAGNOSIS — F4323 Adjustment disorder with mixed anxiety and depressed mood: Secondary | ICD-10-CM | POA: Diagnosis not present

## 2017-01-30 DIAGNOSIS — F4323 Adjustment disorder with mixed anxiety and depressed mood: Secondary | ICD-10-CM | POA: Diagnosis not present

## 2017-02-06 DIAGNOSIS — F4323 Adjustment disorder with mixed anxiety and depressed mood: Secondary | ICD-10-CM | POA: Diagnosis not present

## 2017-02-12 DIAGNOSIS — F4323 Adjustment disorder with mixed anxiety and depressed mood: Secondary | ICD-10-CM | POA: Diagnosis not present

## 2017-02-13 DIAGNOSIS — F411 Generalized anxiety disorder: Secondary | ICD-10-CM | POA: Diagnosis not present

## 2017-02-13 DIAGNOSIS — F331 Major depressive disorder, recurrent, moderate: Secondary | ICD-10-CM | POA: Diagnosis not present

## 2017-02-13 DIAGNOSIS — F341 Dysthymic disorder: Secondary | ICD-10-CM | POA: Diagnosis not present

## 2017-02-20 DIAGNOSIS — F4322 Adjustment disorder with anxiety: Secondary | ICD-10-CM | POA: Diagnosis not present

## 2017-02-27 DIAGNOSIS — F4323 Adjustment disorder with mixed anxiety and depressed mood: Secondary | ICD-10-CM | POA: Diagnosis not present

## 2017-03-06 DIAGNOSIS — F4322 Adjustment disorder with anxiety: Secondary | ICD-10-CM | POA: Diagnosis not present

## 2017-03-13 DIAGNOSIS — F4322 Adjustment disorder with anxiety: Secondary | ICD-10-CM | POA: Diagnosis not present

## 2017-03-20 DIAGNOSIS — F4323 Adjustment disorder with mixed anxiety and depressed mood: Secondary | ICD-10-CM | POA: Diagnosis not present

## 2017-04-10 DIAGNOSIS — F4323 Adjustment disorder with mixed anxiety and depressed mood: Secondary | ICD-10-CM | POA: Diagnosis not present

## 2017-04-23 DIAGNOSIS — F4322 Adjustment disorder with anxiety: Secondary | ICD-10-CM | POA: Diagnosis not present

## 2017-04-24 DIAGNOSIS — F3342 Major depressive disorder, recurrent, in full remission: Secondary | ICD-10-CM | POA: Diagnosis not present

## 2017-05-08 DIAGNOSIS — F4323 Adjustment disorder with mixed anxiety and depressed mood: Secondary | ICD-10-CM | POA: Diagnosis not present

## 2017-05-11 ENCOUNTER — Other Ambulatory Visit: Payer: Self-pay | Admitting: Family Medicine

## 2017-05-11 ENCOUNTER — Other Ambulatory Visit (HOSPITAL_COMMUNITY)
Admission: RE | Admit: 2017-05-11 | Discharge: 2017-05-11 | Disposition: A | Discharge status: Home / Self Care | Payer: BLUE CROSS/BLUE SHIELD | Source: Ambulatory Visit | Attending: Family Medicine | Admitting: Family Medicine

## 2017-05-11 DIAGNOSIS — Z0000 Encounter for general adult medical examination without abnormal findings: Secondary | ICD-10-CM | POA: Diagnosis not present

## 2017-05-11 DIAGNOSIS — Z124 Encounter for screening for malignant neoplasm of cervix: Secondary | ICD-10-CM | POA: Diagnosis not present

## 2017-05-11 DIAGNOSIS — N950 Postmenopausal bleeding: Secondary | ICD-10-CM

## 2017-05-14 ENCOUNTER — Ambulatory Visit
Admission: RE | Admit: 2017-05-14 | Discharge: 2017-05-14 | Disposition: A | Discharge status: Home / Self Care | Payer: BLUE CROSS/BLUE SHIELD | Source: Ambulatory Visit | Attending: Family Medicine | Admitting: Family Medicine

## 2017-05-14 DIAGNOSIS — D259 Leiomyoma of uterus, unspecified: Secondary | ICD-10-CM | POA: Diagnosis not present

## 2017-05-14 DIAGNOSIS — N950 Postmenopausal bleeding: Secondary | ICD-10-CM

## 2017-05-15 DIAGNOSIS — F4323 Adjustment disorder with mixed anxiety and depressed mood: Secondary | ICD-10-CM | POA: Diagnosis not present

## 2017-05-15 LAB — CYTOLOGY - PAP
Diagnosis: NEGATIVE
HPV: NOT DETECTED

## 2017-05-22 DIAGNOSIS — F4323 Adjustment disorder with mixed anxiety and depressed mood: Secondary | ICD-10-CM | POA: Diagnosis not present

## 2017-06-07 DIAGNOSIS — Z13220 Encounter for screening for lipoid disorders: Secondary | ICD-10-CM | POA: Diagnosis not present

## 2017-06-07 DIAGNOSIS — I10 Essential (primary) hypertension: Secondary | ICD-10-CM | POA: Diagnosis not present

## 2017-06-07 DIAGNOSIS — R739 Hyperglycemia, unspecified: Secondary | ICD-10-CM | POA: Diagnosis not present

## 2017-06-07 DIAGNOSIS — E559 Vitamin D deficiency, unspecified: Secondary | ICD-10-CM | POA: Diagnosis not present

## 2017-06-12 DIAGNOSIS — F4322 Adjustment disorder with anxiety: Secondary | ICD-10-CM | POA: Diagnosis not present

## 2017-06-19 DIAGNOSIS — F4322 Adjustment disorder with anxiety: Secondary | ICD-10-CM | POA: Diagnosis not present

## 2017-06-26 DIAGNOSIS — F4322 Adjustment disorder with anxiety: Secondary | ICD-10-CM | POA: Diagnosis not present

## 2017-07-03 DIAGNOSIS — F4323 Adjustment disorder with mixed anxiety and depressed mood: Secondary | ICD-10-CM | POA: Diagnosis not present

## 2017-07-17 DIAGNOSIS — F4323 Adjustment disorder with mixed anxiety and depressed mood: Secondary | ICD-10-CM | POA: Diagnosis not present

## 2017-07-24 DIAGNOSIS — F4323 Adjustment disorder with mixed anxiety and depressed mood: Secondary | ICD-10-CM | POA: Diagnosis not present

## 2017-07-30 DIAGNOSIS — F4323 Adjustment disorder with mixed anxiety and depressed mood: Secondary | ICD-10-CM | POA: Diagnosis not present

## 2017-07-31 DIAGNOSIS — F411 Generalized anxiety disorder: Secondary | ICD-10-CM | POA: Diagnosis not present

## 2017-07-31 DIAGNOSIS — F3342 Major depressive disorder, recurrent, in full remission: Secondary | ICD-10-CM | POA: Diagnosis not present

## 2017-08-21 DIAGNOSIS — F4323 Adjustment disorder with mixed anxiety and depressed mood: Secondary | ICD-10-CM | POA: Diagnosis not present

## 2017-08-28 DIAGNOSIS — F4323 Adjustment disorder with mixed anxiety and depressed mood: Secondary | ICD-10-CM | POA: Diagnosis not present

## 2017-09-04 DIAGNOSIS — N898 Other specified noninflammatory disorders of vagina: Secondary | ICD-10-CM | POA: Diagnosis not present

## 2017-09-04 DIAGNOSIS — R300 Dysuria: Secondary | ICD-10-CM | POA: Diagnosis not present

## 2017-09-11 DIAGNOSIS — F4323 Adjustment disorder with mixed anxiety and depressed mood: Secondary | ICD-10-CM | POA: Diagnosis not present

## 2017-09-25 DIAGNOSIS — F4323 Adjustment disorder with mixed anxiety and depressed mood: Secondary | ICD-10-CM | POA: Diagnosis not present

## 2017-10-05 DIAGNOSIS — H2513 Age-related nuclear cataract, bilateral: Secondary | ICD-10-CM | POA: Diagnosis not present

## 2017-10-09 DIAGNOSIS — F4323 Adjustment disorder with mixed anxiety and depressed mood: Secondary | ICD-10-CM | POA: Diagnosis not present

## 2017-10-16 DIAGNOSIS — F4323 Adjustment disorder with mixed anxiety and depressed mood: Secondary | ICD-10-CM | POA: Diagnosis not present

## 2017-10-30 DIAGNOSIS — F4323 Adjustment disorder with mixed anxiety and depressed mood: Secondary | ICD-10-CM | POA: Diagnosis not present

## 2017-11-27 DIAGNOSIS — F4323 Adjustment disorder with mixed anxiety and depressed mood: Secondary | ICD-10-CM | POA: Diagnosis not present

## 2017-12-04 DIAGNOSIS — F4323 Adjustment disorder with mixed anxiety and depressed mood: Secondary | ICD-10-CM | POA: Diagnosis not present

## 2017-12-25 DIAGNOSIS — F4323 Adjustment disorder with mixed anxiety and depressed mood: Secondary | ICD-10-CM | POA: Diagnosis not present

## 2018-01-08 DIAGNOSIS — F4323 Adjustment disorder with mixed anxiety and depressed mood: Secondary | ICD-10-CM | POA: Diagnosis not present

## 2018-01-22 DIAGNOSIS — F4323 Adjustment disorder with mixed anxiety and depressed mood: Secondary | ICD-10-CM | POA: Diagnosis not present

## 2018-01-23 DIAGNOSIS — F3342 Major depressive disorder, recurrent, in full remission: Secondary | ICD-10-CM | POA: Diagnosis not present

## 2018-01-29 DIAGNOSIS — F4323 Adjustment disorder with mixed anxiety and depressed mood: Secondary | ICD-10-CM | POA: Diagnosis not present

## 2018-02-12 DIAGNOSIS — F4323 Adjustment disorder with mixed anxiety and depressed mood: Secondary | ICD-10-CM | POA: Diagnosis not present

## 2018-02-19 DIAGNOSIS — F4323 Adjustment disorder with mixed anxiety and depressed mood: Secondary | ICD-10-CM | POA: Diagnosis not present

## 2018-03-12 DIAGNOSIS — F4323 Adjustment disorder with mixed anxiety and depressed mood: Secondary | ICD-10-CM | POA: Diagnosis not present

## 2018-03-20 DIAGNOSIS — H8109 Meniere's disease, unspecified ear: Secondary | ICD-10-CM | POA: Diagnosis not present

## 2018-03-20 DIAGNOSIS — N898 Other specified noninflammatory disorders of vagina: Secondary | ICD-10-CM | POA: Diagnosis not present

## 2018-04-30 DIAGNOSIS — F4323 Adjustment disorder with mixed anxiety and depressed mood: Secondary | ICD-10-CM | POA: Diagnosis not present

## 2018-05-07 DIAGNOSIS — F4323 Adjustment disorder with mixed anxiety and depressed mood: Secondary | ICD-10-CM | POA: Diagnosis not present

## 2018-05-14 DIAGNOSIS — F4323 Adjustment disorder with mixed anxiety and depressed mood: Secondary | ICD-10-CM | POA: Diagnosis not present

## 2018-05-21 DIAGNOSIS — F4323 Adjustment disorder with mixed anxiety and depressed mood: Secondary | ICD-10-CM | POA: Diagnosis not present

## 2018-05-28 DIAGNOSIS — F4323 Adjustment disorder with mixed anxiety and depressed mood: Secondary | ICD-10-CM | POA: Diagnosis not present

## 2018-06-03 DIAGNOSIS — E611 Iron deficiency: Secondary | ICD-10-CM | POA: Diagnosis not present

## 2018-06-03 DIAGNOSIS — N898 Other specified noninflammatory disorders of vagina: Secondary | ICD-10-CM | POA: Diagnosis not present

## 2018-06-03 DIAGNOSIS — Z136 Encounter for screening for cardiovascular disorders: Secondary | ICD-10-CM | POA: Diagnosis not present

## 2018-06-03 DIAGNOSIS — E559 Vitamin D deficiency, unspecified: Secondary | ICD-10-CM | POA: Diagnosis not present

## 2018-06-03 DIAGNOSIS — I10 Essential (primary) hypertension: Secondary | ICD-10-CM | POA: Diagnosis not present

## 2018-06-03 DIAGNOSIS — R739 Hyperglycemia, unspecified: Secondary | ICD-10-CM | POA: Diagnosis not present

## 2018-06-03 DIAGNOSIS — Z0000 Encounter for general adult medical examination without abnormal findings: Secondary | ICD-10-CM | POA: Diagnosis not present

## 2018-06-06 DIAGNOSIS — F411 Generalized anxiety disorder: Secondary | ICD-10-CM | POA: Diagnosis not present

## 2018-06-06 DIAGNOSIS — F332 Major depressive disorder, recurrent severe without psychotic features: Secondary | ICD-10-CM | POA: Diagnosis not present

## 2018-06-18 DIAGNOSIS — F4323 Adjustment disorder with mixed anxiety and depressed mood: Secondary | ICD-10-CM | POA: Diagnosis not present

## 2018-06-25 DIAGNOSIS — F4323 Adjustment disorder with mixed anxiety and depressed mood: Secondary | ICD-10-CM | POA: Diagnosis not present

## 2018-07-02 DIAGNOSIS — F4323 Adjustment disorder with mixed anxiety and depressed mood: Secondary | ICD-10-CM | POA: Diagnosis not present

## 2018-07-16 DIAGNOSIS — F4322 Adjustment disorder with anxiety: Secondary | ICD-10-CM | POA: Diagnosis not present

## 2018-07-19 DIAGNOSIS — R5382 Chronic fatigue, unspecified: Secondary | ICD-10-CM | POA: Diagnosis not present

## 2018-07-19 DIAGNOSIS — J4521 Mild intermittent asthma with (acute) exacerbation: Secondary | ICD-10-CM | POA: Diagnosis not present

## 2018-07-19 DIAGNOSIS — A499 Bacterial infection, unspecified: Secondary | ICD-10-CM | POA: Diagnosis not present

## 2018-07-22 DIAGNOSIS — J029 Acute pharyngitis, unspecified: Secondary | ICD-10-CM | POA: Diagnosis not present

## 2018-07-22 DIAGNOSIS — R05 Cough: Secondary | ICD-10-CM | POA: Diagnosis not present

## 2018-07-23 DIAGNOSIS — F4321 Adjustment disorder with depressed mood: Secondary | ICD-10-CM | POA: Diagnosis not present

## 2018-07-30 DIAGNOSIS — F4323 Adjustment disorder with mixed anxiety and depressed mood: Secondary | ICD-10-CM | POA: Diagnosis not present

## 2018-08-06 DIAGNOSIS — F4323 Adjustment disorder with mixed anxiety and depressed mood: Secondary | ICD-10-CM | POA: Diagnosis not present

## 2018-08-08 DIAGNOSIS — F331 Major depressive disorder, recurrent, moderate: Secondary | ICD-10-CM | POA: Diagnosis not present

## 2018-08-08 DIAGNOSIS — F410 Panic disorder [episodic paroxysmal anxiety] without agoraphobia: Secondary | ICD-10-CM | POA: Diagnosis not present

## 2018-08-08 DIAGNOSIS — F341 Dysthymic disorder: Secondary | ICD-10-CM | POA: Diagnosis not present

## 2018-08-13 DIAGNOSIS — F4323 Adjustment disorder with mixed anxiety and depressed mood: Secondary | ICD-10-CM | POA: Diagnosis not present

## 2018-08-27 DIAGNOSIS — F4322 Adjustment disorder with anxiety: Secondary | ICD-10-CM | POA: Diagnosis not present

## 2018-08-27 DIAGNOSIS — F332 Major depressive disorder, recurrent severe without psychotic features: Secondary | ICD-10-CM | POA: Diagnosis not present

## 2018-08-29 DIAGNOSIS — F332 Major depressive disorder, recurrent severe without psychotic features: Secondary | ICD-10-CM | POA: Diagnosis not present

## 2018-09-03 DIAGNOSIS — F332 Major depressive disorder, recurrent severe without psychotic features: Secondary | ICD-10-CM | POA: Diagnosis not present

## 2018-09-05 DIAGNOSIS — F332 Major depressive disorder, recurrent severe without psychotic features: Secondary | ICD-10-CM | POA: Diagnosis not present

## 2018-09-09 DIAGNOSIS — F332 Major depressive disorder, recurrent severe without psychotic features: Secondary | ICD-10-CM | POA: Diagnosis not present

## 2018-09-10 DIAGNOSIS — F4322 Adjustment disorder with anxiety: Secondary | ICD-10-CM | POA: Diagnosis not present

## 2018-09-12 DIAGNOSIS — F332 Major depressive disorder, recurrent severe without psychotic features: Secondary | ICD-10-CM | POA: Diagnosis not present

## 2018-09-16 DIAGNOSIS — F332 Major depressive disorder, recurrent severe without psychotic features: Secondary | ICD-10-CM | POA: Diagnosis not present

## 2018-09-18 DIAGNOSIS — F332 Major depressive disorder, recurrent severe without psychotic features: Secondary | ICD-10-CM | POA: Diagnosis not present

## 2018-09-23 DIAGNOSIS — F332 Major depressive disorder, recurrent severe without psychotic features: Secondary | ICD-10-CM | POA: Diagnosis not present

## 2018-09-24 DIAGNOSIS — F4323 Adjustment disorder with mixed anxiety and depressed mood: Secondary | ICD-10-CM | POA: Diagnosis not present

## 2018-09-25 DIAGNOSIS — F332 Major depressive disorder, recurrent severe without psychotic features: Secondary | ICD-10-CM | POA: Diagnosis not present

## 2018-10-01 DIAGNOSIS — F4322 Adjustment disorder with anxiety: Secondary | ICD-10-CM | POA: Diagnosis not present

## 2018-10-03 DIAGNOSIS — F332 Major depressive disorder, recurrent severe without psychotic features: Secondary | ICD-10-CM | POA: Diagnosis not present

## 2018-10-07 DIAGNOSIS — F332 Major depressive disorder, recurrent severe without psychotic features: Secondary | ICD-10-CM | POA: Diagnosis not present

## 2018-10-08 DIAGNOSIS — F332 Major depressive disorder, recurrent severe without psychotic features: Secondary | ICD-10-CM | POA: Diagnosis not present

## 2018-10-08 DIAGNOSIS — F4322 Adjustment disorder with anxiety: Secondary | ICD-10-CM | POA: Diagnosis not present

## 2018-10-14 DIAGNOSIS — F332 Major depressive disorder, recurrent severe without psychotic features: Secondary | ICD-10-CM | POA: Diagnosis not present

## 2018-10-15 DIAGNOSIS — F4322 Adjustment disorder with anxiety: Secondary | ICD-10-CM | POA: Diagnosis not present

## 2018-10-18 DIAGNOSIS — F332 Major depressive disorder, recurrent severe without psychotic features: Secondary | ICD-10-CM | POA: Diagnosis not present

## 2018-10-21 DIAGNOSIS — F4322 Adjustment disorder with anxiety: Secondary | ICD-10-CM | POA: Diagnosis not present

## 2018-10-22 DIAGNOSIS — F332 Major depressive disorder, recurrent severe without psychotic features: Secondary | ICD-10-CM | POA: Diagnosis not present

## 2018-10-24 DIAGNOSIS — F332 Major depressive disorder, recurrent severe without psychotic features: Secondary | ICD-10-CM | POA: Diagnosis not present

## 2018-10-28 DIAGNOSIS — F332 Major depressive disorder, recurrent severe without psychotic features: Secondary | ICD-10-CM | POA: Diagnosis not present

## 2018-10-29 DIAGNOSIS — F4322 Adjustment disorder with anxiety: Secondary | ICD-10-CM | POA: Diagnosis not present

## 2018-11-06 DIAGNOSIS — F332 Major depressive disorder, recurrent severe without psychotic features: Secondary | ICD-10-CM | POA: Diagnosis not present

## 2018-11-11 DIAGNOSIS — F332 Major depressive disorder, recurrent severe without psychotic features: Secondary | ICD-10-CM | POA: Diagnosis not present

## 2018-11-12 DIAGNOSIS — F4322 Adjustment disorder with anxiety: Secondary | ICD-10-CM | POA: Diagnosis not present

## 2018-11-18 DIAGNOSIS — F332 Major depressive disorder, recurrent severe without psychotic features: Secondary | ICD-10-CM | POA: Diagnosis not present

## 2018-11-19 DIAGNOSIS — F4322 Adjustment disorder with anxiety: Secondary | ICD-10-CM | POA: Diagnosis not present

## 2018-12-02 DIAGNOSIS — F332 Major depressive disorder, recurrent severe without psychotic features: Secondary | ICD-10-CM | POA: Diagnosis not present

## 2018-12-03 DIAGNOSIS — F332 Major depressive disorder, recurrent severe without psychotic features: Secondary | ICD-10-CM | POA: Diagnosis not present

## 2018-12-03 DIAGNOSIS — F4323 Adjustment disorder with mixed anxiety and depressed mood: Secondary | ICD-10-CM | POA: Diagnosis not present

## 2018-12-10 DIAGNOSIS — F332 Major depressive disorder, recurrent severe without psychotic features: Secondary | ICD-10-CM | POA: Diagnosis not present

## 2018-12-11 DIAGNOSIS — F4322 Adjustment disorder with anxiety: Secondary | ICD-10-CM | POA: Diagnosis not present

## 2018-12-18 DIAGNOSIS — F332 Major depressive disorder, recurrent severe without psychotic features: Secondary | ICD-10-CM | POA: Diagnosis not present

## 2018-12-23 DIAGNOSIS — F332 Major depressive disorder, recurrent severe without psychotic features: Secondary | ICD-10-CM | POA: Diagnosis not present

## 2018-12-24 DIAGNOSIS — F4322 Adjustment disorder with anxiety: Secondary | ICD-10-CM | POA: Diagnosis not present

## 2018-12-25 DIAGNOSIS — F332 Major depressive disorder, recurrent severe without psychotic features: Secondary | ICD-10-CM | POA: Diagnosis not present

## 2018-12-30 DIAGNOSIS — R109 Unspecified abdominal pain: Secondary | ICD-10-CM | POA: Diagnosis not present

## 2018-12-30 DIAGNOSIS — R197 Diarrhea, unspecified: Secondary | ICD-10-CM | POA: Diagnosis not present

## 2018-12-31 DIAGNOSIS — F332 Major depressive disorder, recurrent severe without psychotic features: Secondary | ICD-10-CM | POA: Diagnosis not present

## 2019-01-06 DIAGNOSIS — F332 Major depressive disorder, recurrent severe without psychotic features: Secondary | ICD-10-CM | POA: Diagnosis not present

## 2019-01-07 DIAGNOSIS — F4322 Adjustment disorder with anxiety: Secondary | ICD-10-CM | POA: Diagnosis not present

## 2019-01-08 DIAGNOSIS — F332 Major depressive disorder, recurrent severe without psychotic features: Secondary | ICD-10-CM | POA: Diagnosis not present

## 2019-01-14 DIAGNOSIS — F4322 Adjustment disorder with anxiety: Secondary | ICD-10-CM | POA: Diagnosis not present

## 2019-01-15 DIAGNOSIS — F332 Major depressive disorder, recurrent severe without psychotic features: Secondary | ICD-10-CM | POA: Diagnosis not present

## 2019-01-21 DIAGNOSIS — F4322 Adjustment disorder with anxiety: Secondary | ICD-10-CM | POA: Diagnosis not present

## 2019-01-23 DIAGNOSIS — F332 Major depressive disorder, recurrent severe without psychotic features: Secondary | ICD-10-CM | POA: Diagnosis not present

## 2019-01-27 DIAGNOSIS — F332 Major depressive disorder, recurrent severe without psychotic features: Secondary | ICD-10-CM | POA: Diagnosis not present

## 2019-01-28 DIAGNOSIS — F332 Major depressive disorder, recurrent severe without psychotic features: Secondary | ICD-10-CM | POA: Diagnosis not present

## 2019-01-31 DIAGNOSIS — F332 Major depressive disorder, recurrent severe without psychotic features: Secondary | ICD-10-CM | POA: Diagnosis not present

## 2019-02-03 DIAGNOSIS — F332 Major depressive disorder, recurrent severe without psychotic features: Secondary | ICD-10-CM | POA: Diagnosis not present

## 2019-02-04 DIAGNOSIS — F332 Major depressive disorder, recurrent severe without psychotic features: Secondary | ICD-10-CM | POA: Diagnosis not present

## 2019-02-06 DIAGNOSIS — F332 Major depressive disorder, recurrent severe without psychotic features: Secondary | ICD-10-CM | POA: Diagnosis not present

## 2019-02-10 DIAGNOSIS — R630 Anorexia: Secondary | ICD-10-CM | POA: Insufficient documentation

## 2019-02-10 DIAGNOSIS — F332 Major depressive disorder, recurrent severe without psychotic features: Secondary | ICD-10-CM | POA: Diagnosis not present

## 2019-02-12 DIAGNOSIS — F332 Major depressive disorder, recurrent severe without psychotic features: Secondary | ICD-10-CM | POA: Diagnosis not present

## 2019-02-17 DIAGNOSIS — F332 Major depressive disorder, recurrent severe without psychotic features: Secondary | ICD-10-CM | POA: Diagnosis not present

## 2019-02-18 DIAGNOSIS — F4322 Adjustment disorder with anxiety: Secondary | ICD-10-CM | POA: Diagnosis not present

## 2019-02-19 DIAGNOSIS — F332 Major depressive disorder, recurrent severe without psychotic features: Secondary | ICD-10-CM | POA: Diagnosis not present

## 2019-02-20 DIAGNOSIS — F341 Dysthymic disorder: Secondary | ICD-10-CM | POA: Diagnosis not present

## 2019-02-20 DIAGNOSIS — F410 Panic disorder [episodic paroxysmal anxiety] without agoraphobia: Secondary | ICD-10-CM | POA: Diagnosis not present

## 2019-02-20 DIAGNOSIS — F3342 Major depressive disorder, recurrent, in full remission: Secondary | ICD-10-CM | POA: Diagnosis not present

## 2019-02-25 DIAGNOSIS — F4322 Adjustment disorder with anxiety: Secondary | ICD-10-CM | POA: Diagnosis not present

## 2019-03-05 DIAGNOSIS — F332 Major depressive disorder, recurrent severe without psychotic features: Secondary | ICD-10-CM | POA: Diagnosis not present

## 2019-03-10 DIAGNOSIS — F332 Major depressive disorder, recurrent severe without psychotic features: Secondary | ICD-10-CM | POA: Diagnosis not present

## 2019-03-11 DIAGNOSIS — F4322 Adjustment disorder with anxiety: Secondary | ICD-10-CM | POA: Diagnosis not present

## 2019-03-12 DIAGNOSIS — F332 Major depressive disorder, recurrent severe without psychotic features: Secondary | ICD-10-CM | POA: Diagnosis not present

## 2019-03-17 DIAGNOSIS — F332 Major depressive disorder, recurrent severe without psychotic features: Secondary | ICD-10-CM | POA: Diagnosis not present

## 2019-03-19 DIAGNOSIS — F332 Major depressive disorder, recurrent severe without psychotic features: Secondary | ICD-10-CM | POA: Diagnosis not present

## 2019-03-25 DIAGNOSIS — F332 Major depressive disorder, recurrent severe without psychotic features: Secondary | ICD-10-CM | POA: Diagnosis not present

## 2019-04-01 DIAGNOSIS — F332 Major depressive disorder, recurrent severe without psychotic features: Secondary | ICD-10-CM | POA: Diagnosis not present

## 2019-04-02 DIAGNOSIS — F4322 Adjustment disorder with anxiety: Secondary | ICD-10-CM | POA: Diagnosis not present

## 2019-04-07 DIAGNOSIS — F332 Major depressive disorder, recurrent severe without psychotic features: Secondary | ICD-10-CM | POA: Diagnosis not present

## 2019-04-09 DIAGNOSIS — F332 Major depressive disorder, recurrent severe without psychotic features: Secondary | ICD-10-CM | POA: Diagnosis not present

## 2019-04-14 DIAGNOSIS — F4322 Adjustment disorder with anxiety: Secondary | ICD-10-CM | POA: Diagnosis not present

## 2019-04-15 DIAGNOSIS — F332 Major depressive disorder, recurrent severe without psychotic features: Secondary | ICD-10-CM | POA: Diagnosis not present

## 2019-04-17 DIAGNOSIS — F332 Major depressive disorder, recurrent severe without psychotic features: Secondary | ICD-10-CM | POA: Diagnosis not present

## 2019-04-21 DIAGNOSIS — F332 Major depressive disorder, recurrent severe without psychotic features: Secondary | ICD-10-CM | POA: Diagnosis not present

## 2019-04-28 DIAGNOSIS — F332 Major depressive disorder, recurrent severe without psychotic features: Secondary | ICD-10-CM | POA: Diagnosis not present

## 2019-04-29 DIAGNOSIS — F4322 Adjustment disorder with anxiety: Secondary | ICD-10-CM | POA: Diagnosis not present

## 2019-04-30 DIAGNOSIS — F332 Major depressive disorder, recurrent severe without psychotic features: Secondary | ICD-10-CM | POA: Diagnosis not present

## 2019-05-05 DIAGNOSIS — F332 Major depressive disorder, recurrent severe without psychotic features: Secondary | ICD-10-CM | POA: Diagnosis not present

## 2019-05-07 DIAGNOSIS — F332 Major depressive disorder, recurrent severe without psychotic features: Secondary | ICD-10-CM | POA: Diagnosis not present

## 2019-05-13 DIAGNOSIS — F332 Major depressive disorder, recurrent severe without psychotic features: Secondary | ICD-10-CM | POA: Diagnosis not present

## 2019-05-14 DIAGNOSIS — F4323 Adjustment disorder with mixed anxiety and depressed mood: Secondary | ICD-10-CM | POA: Diagnosis not present

## 2019-05-15 DIAGNOSIS — F332 Major depressive disorder, recurrent severe without psychotic features: Secondary | ICD-10-CM | POA: Diagnosis not present

## 2019-05-19 DIAGNOSIS — F332 Major depressive disorder, recurrent severe without psychotic features: Secondary | ICD-10-CM | POA: Diagnosis not present

## 2019-05-20 DIAGNOSIS — F332 Major depressive disorder, recurrent severe without psychotic features: Secondary | ICD-10-CM | POA: Diagnosis not present

## 2019-05-26 DIAGNOSIS — F4322 Adjustment disorder with anxiety: Secondary | ICD-10-CM | POA: Diagnosis not present

## 2019-05-27 DIAGNOSIS — F332 Major depressive disorder, recurrent severe without psychotic features: Secondary | ICD-10-CM | POA: Diagnosis not present

## 2019-05-29 DIAGNOSIS — F332 Major depressive disorder, recurrent severe without psychotic features: Secondary | ICD-10-CM | POA: Diagnosis not present

## 2019-06-02 DIAGNOSIS — F332 Major depressive disorder, recurrent severe without psychotic features: Secondary | ICD-10-CM | POA: Diagnosis not present

## 2019-06-03 DIAGNOSIS — F4322 Adjustment disorder with anxiety: Secondary | ICD-10-CM | POA: Diagnosis not present

## 2019-06-04 DIAGNOSIS — F332 Major depressive disorder, recurrent severe without psychotic features: Secondary | ICD-10-CM | POA: Diagnosis not present

## 2019-06-09 DIAGNOSIS — F332 Major depressive disorder, recurrent severe without psychotic features: Secondary | ICD-10-CM | POA: Diagnosis not present

## 2019-06-10 DIAGNOSIS — F4322 Adjustment disorder with anxiety: Secondary | ICD-10-CM | POA: Diagnosis not present

## 2019-06-11 DIAGNOSIS — F332 Major depressive disorder, recurrent severe without psychotic features: Secondary | ICD-10-CM | POA: Diagnosis not present

## 2019-06-16 DIAGNOSIS — F332 Major depressive disorder, recurrent severe without psychotic features: Secondary | ICD-10-CM | POA: Diagnosis not present

## 2019-06-17 DIAGNOSIS — F4322 Adjustment disorder with anxiety: Secondary | ICD-10-CM | POA: Diagnosis not present

## 2019-06-18 DIAGNOSIS — F332 Major depressive disorder, recurrent severe without psychotic features: Secondary | ICD-10-CM | POA: Diagnosis not present

## 2019-06-23 DIAGNOSIS — F332 Major depressive disorder, recurrent severe without psychotic features: Secondary | ICD-10-CM | POA: Diagnosis not present

## 2019-06-24 DIAGNOSIS — F4322 Adjustment disorder with anxiety: Secondary | ICD-10-CM | POA: Diagnosis not present

## 2019-06-25 DIAGNOSIS — F332 Major depressive disorder, recurrent severe without psychotic features: Secondary | ICD-10-CM | POA: Diagnosis not present

## 2019-06-26 DIAGNOSIS — R5382 Chronic fatigue, unspecified: Secondary | ICD-10-CM | POA: Diagnosis not present

## 2019-06-26 DIAGNOSIS — Z0000 Encounter for general adult medical examination without abnormal findings: Secondary | ICD-10-CM | POA: Diagnosis not present

## 2019-06-26 DIAGNOSIS — K9041 Non-celiac gluten sensitivity: Secondary | ICD-10-CM | POA: Diagnosis not present

## 2019-06-26 DIAGNOSIS — Z13220 Encounter for screening for lipoid disorders: Secondary | ICD-10-CM | POA: Diagnosis not present

## 2019-06-26 DIAGNOSIS — E559 Vitamin D deficiency, unspecified: Secondary | ICD-10-CM | POA: Diagnosis not present

## 2019-06-26 DIAGNOSIS — I10 Essential (primary) hypertension: Secondary | ICD-10-CM | POA: Diagnosis not present

## 2019-06-30 DIAGNOSIS — F332 Major depressive disorder, recurrent severe without psychotic features: Secondary | ICD-10-CM | POA: Diagnosis not present

## 2019-06-30 DIAGNOSIS — F4322 Adjustment disorder with anxiety: Secondary | ICD-10-CM | POA: Diagnosis not present

## 2019-07-02 DIAGNOSIS — F332 Major depressive disorder, recurrent severe without psychotic features: Secondary | ICD-10-CM | POA: Diagnosis not present

## 2019-07-09 DIAGNOSIS — F332 Major depressive disorder, recurrent severe without psychotic features: Secondary | ICD-10-CM | POA: Diagnosis not present

## 2019-07-14 DIAGNOSIS — F332 Major depressive disorder, recurrent severe without psychotic features: Secondary | ICD-10-CM | POA: Diagnosis not present

## 2019-07-15 DIAGNOSIS — F332 Major depressive disorder, recurrent severe without psychotic features: Secondary | ICD-10-CM | POA: Diagnosis not present

## 2019-07-16 DIAGNOSIS — F332 Major depressive disorder, recurrent severe without psychotic features: Secondary | ICD-10-CM | POA: Diagnosis not present

## 2019-07-21 DIAGNOSIS — F332 Major depressive disorder, recurrent severe without psychotic features: Secondary | ICD-10-CM | POA: Diagnosis not present

## 2019-07-28 DIAGNOSIS — F332 Major depressive disorder, recurrent severe without psychotic features: Secondary | ICD-10-CM | POA: Diagnosis not present

## 2019-07-29 DIAGNOSIS — F4322 Adjustment disorder with anxiety: Secondary | ICD-10-CM | POA: Diagnosis not present

## 2019-07-30 DIAGNOSIS — F332 Major depressive disorder, recurrent severe without psychotic features: Secondary | ICD-10-CM | POA: Diagnosis not present

## 2019-08-04 DIAGNOSIS — F332 Major depressive disorder, recurrent severe without psychotic features: Secondary | ICD-10-CM | POA: Diagnosis not present

## 2019-08-05 DIAGNOSIS — F332 Major depressive disorder, recurrent severe without psychotic features: Secondary | ICD-10-CM | POA: Diagnosis not present

## 2019-08-06 DIAGNOSIS — F332 Major depressive disorder, recurrent severe without psychotic features: Secondary | ICD-10-CM | POA: Diagnosis not present

## 2019-08-11 DIAGNOSIS — F332 Major depressive disorder, recurrent severe without psychotic features: Secondary | ICD-10-CM | POA: Diagnosis not present

## 2019-08-12 DIAGNOSIS — F4322 Adjustment disorder with anxiety: Secondary | ICD-10-CM | POA: Diagnosis not present

## 2019-08-13 DIAGNOSIS — F332 Major depressive disorder, recurrent severe without psychotic features: Secondary | ICD-10-CM | POA: Diagnosis not present

## 2019-08-18 DIAGNOSIS — F332 Major depressive disorder, recurrent severe without psychotic features: Secondary | ICD-10-CM | POA: Diagnosis not present

## 2019-08-20 DIAGNOSIS — K120 Recurrent oral aphthae: Secondary | ICD-10-CM | POA: Diagnosis not present

## 2019-08-20 DIAGNOSIS — F332 Major depressive disorder, recurrent severe without psychotic features: Secondary | ICD-10-CM | POA: Diagnosis not present

## 2019-08-22 DIAGNOSIS — H43813 Vitreous degeneration, bilateral: Secondary | ICD-10-CM | POA: Diagnosis not present

## 2019-08-25 DIAGNOSIS — F332 Major depressive disorder, recurrent severe without psychotic features: Secondary | ICD-10-CM | POA: Diagnosis not present

## 2019-08-26 DIAGNOSIS — F4322 Adjustment disorder with anxiety: Secondary | ICD-10-CM | POA: Diagnosis not present

## 2019-08-27 DIAGNOSIS — F332 Major depressive disorder, recurrent severe without psychotic features: Secondary | ICD-10-CM | POA: Diagnosis not present

## 2019-09-03 DIAGNOSIS — F332 Major depressive disorder, recurrent severe without psychotic features: Secondary | ICD-10-CM | POA: Diagnosis not present

## 2019-09-05 DIAGNOSIS — F332 Major depressive disorder, recurrent severe without psychotic features: Secondary | ICD-10-CM | POA: Diagnosis not present

## 2019-09-08 DIAGNOSIS — F332 Major depressive disorder, recurrent severe without psychotic features: Secondary | ICD-10-CM | POA: Diagnosis not present

## 2019-09-09 DIAGNOSIS — F4322 Adjustment disorder with anxiety: Secondary | ICD-10-CM | POA: Diagnosis not present

## 2019-09-10 DIAGNOSIS — F332 Major depressive disorder, recurrent severe without psychotic features: Secondary | ICD-10-CM | POA: Diagnosis not present

## 2019-09-22 DIAGNOSIS — F332 Major depressive disorder, recurrent severe without psychotic features: Secondary | ICD-10-CM | POA: Diagnosis not present

## 2019-09-23 DIAGNOSIS — F4322 Adjustment disorder with anxiety: Secondary | ICD-10-CM | POA: Diagnosis not present

## 2019-09-24 DIAGNOSIS — F332 Major depressive disorder, recurrent severe without psychotic features: Secondary | ICD-10-CM | POA: Diagnosis not present

## 2019-10-06 DIAGNOSIS — F332 Major depressive disorder, recurrent severe without psychotic features: Secondary | ICD-10-CM | POA: Diagnosis not present

## 2019-10-08 DIAGNOSIS — F332 Major depressive disorder, recurrent severe without psychotic features: Secondary | ICD-10-CM | POA: Diagnosis not present

## 2019-10-13 DIAGNOSIS — F332 Major depressive disorder, recurrent severe without psychotic features: Secondary | ICD-10-CM | POA: Diagnosis not present

## 2019-10-15 DIAGNOSIS — F332 Major depressive disorder, recurrent severe without psychotic features: Secondary | ICD-10-CM | POA: Diagnosis not present

## 2019-10-20 DIAGNOSIS — F332 Major depressive disorder, recurrent severe without psychotic features: Secondary | ICD-10-CM | POA: Diagnosis not present

## 2019-10-21 DIAGNOSIS — F4322 Adjustment disorder with anxiety: Secondary | ICD-10-CM | POA: Diagnosis not present

## 2019-10-22 DIAGNOSIS — F332 Major depressive disorder, recurrent severe without psychotic features: Secondary | ICD-10-CM | POA: Diagnosis not present

## 2019-10-27 DIAGNOSIS — F332 Major depressive disorder, recurrent severe without psychotic features: Secondary | ICD-10-CM | POA: Diagnosis not present

## 2019-10-29 DIAGNOSIS — F332 Major depressive disorder, recurrent severe without psychotic features: Secondary | ICD-10-CM | POA: Diagnosis not present

## 2019-11-03 DIAGNOSIS — F4322 Adjustment disorder with anxiety: Secondary | ICD-10-CM | POA: Diagnosis not present

## 2019-11-05 DIAGNOSIS — F332 Major depressive disorder, recurrent severe without psychotic features: Secondary | ICD-10-CM | POA: Diagnosis not present

## 2019-11-07 DIAGNOSIS — F332 Major depressive disorder, recurrent severe without psychotic features: Secondary | ICD-10-CM | POA: Diagnosis not present

## 2019-11-10 DIAGNOSIS — F332 Major depressive disorder, recurrent severe without psychotic features: Secondary | ICD-10-CM | POA: Diagnosis not present

## 2019-11-11 DIAGNOSIS — F4322 Adjustment disorder with anxiety: Secondary | ICD-10-CM | POA: Diagnosis not present

## 2019-11-12 DIAGNOSIS — F332 Major depressive disorder, recurrent severe without psychotic features: Secondary | ICD-10-CM | POA: Diagnosis not present

## 2019-11-17 DIAGNOSIS — F332 Major depressive disorder, recurrent severe without psychotic features: Secondary | ICD-10-CM | POA: Diagnosis not present

## 2019-11-18 DIAGNOSIS — F4322 Adjustment disorder with anxiety: Secondary | ICD-10-CM | POA: Diagnosis not present

## 2019-11-20 DIAGNOSIS — F332 Major depressive disorder, recurrent severe without psychotic features: Secondary | ICD-10-CM | POA: Diagnosis not present

## 2019-11-24 DIAGNOSIS — F332 Major depressive disorder, recurrent severe without psychotic features: Secondary | ICD-10-CM | POA: Diagnosis not present

## 2019-11-26 DIAGNOSIS — F332 Major depressive disorder, recurrent severe without psychotic features: Secondary | ICD-10-CM | POA: Diagnosis not present

## 2019-12-01 DIAGNOSIS — F332 Major depressive disorder, recurrent severe without psychotic features: Secondary | ICD-10-CM | POA: Diagnosis not present

## 2019-12-02 DIAGNOSIS — F4323 Adjustment disorder with mixed anxiety and depressed mood: Secondary | ICD-10-CM | POA: Diagnosis not present

## 2019-12-03 DIAGNOSIS — F332 Major depressive disorder, recurrent severe without psychotic features: Secondary | ICD-10-CM | POA: Diagnosis not present

## 2019-12-10 DIAGNOSIS — F332 Major depressive disorder, recurrent severe without psychotic features: Secondary | ICD-10-CM | POA: Diagnosis not present

## 2019-12-12 DIAGNOSIS — F332 Major depressive disorder, recurrent severe without psychotic features: Secondary | ICD-10-CM | POA: Diagnosis not present

## 2019-12-15 DIAGNOSIS — F332 Major depressive disorder, recurrent severe without psychotic features: Secondary | ICD-10-CM | POA: Diagnosis not present

## 2019-12-17 DIAGNOSIS — F332 Major depressive disorder, recurrent severe without psychotic features: Secondary | ICD-10-CM | POA: Diagnosis not present

## 2019-12-22 DIAGNOSIS — F332 Major depressive disorder, recurrent severe without psychotic features: Secondary | ICD-10-CM | POA: Diagnosis not present

## 2020-01-05 DIAGNOSIS — F332 Major depressive disorder, recurrent severe without psychotic features: Secondary | ICD-10-CM | POA: Diagnosis not present

## 2020-01-07 DIAGNOSIS — F332 Major depressive disorder, recurrent severe without psychotic features: Secondary | ICD-10-CM | POA: Diagnosis not present

## 2020-01-12 DIAGNOSIS — F332 Major depressive disorder, recurrent severe without psychotic features: Secondary | ICD-10-CM | POA: Diagnosis not present

## 2020-01-13 DIAGNOSIS — F4323 Adjustment disorder with mixed anxiety and depressed mood: Secondary | ICD-10-CM | POA: Diagnosis not present

## 2020-01-14 DIAGNOSIS — F332 Major depressive disorder, recurrent severe without psychotic features: Secondary | ICD-10-CM | POA: Diagnosis not present

## 2020-01-23 DIAGNOSIS — F332 Major depressive disorder, recurrent severe without psychotic features: Secondary | ICD-10-CM | POA: Diagnosis not present

## 2020-01-26 DIAGNOSIS — F332 Major depressive disorder, recurrent severe without psychotic features: Secondary | ICD-10-CM | POA: Diagnosis not present

## 2020-01-28 DIAGNOSIS — F332 Major depressive disorder, recurrent severe without psychotic features: Secondary | ICD-10-CM | POA: Diagnosis not present

## 2020-02-02 DIAGNOSIS — F332 Major depressive disorder, recurrent severe without psychotic features: Secondary | ICD-10-CM | POA: Diagnosis not present

## 2020-02-03 IMAGING — US US PELVIS COMPLETE TRANSABD/TRANSVAG
1 series · 13 of 25 positions shown · non-contrast
Comparison: None

CLINICAL DATA: Postmenopausal bleeding.



[Series 1: us pelvis complete transabd/transvag · 0.22mm/px · 13 of 39 slices shown]
[im 1/39]
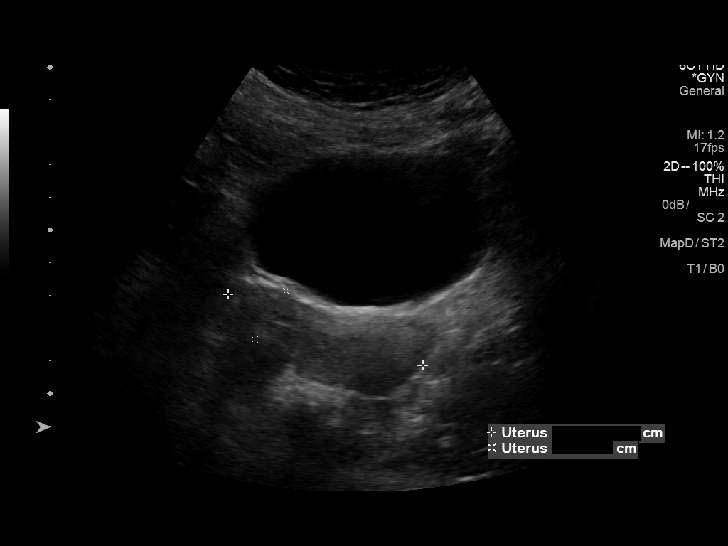
[im 4/39]
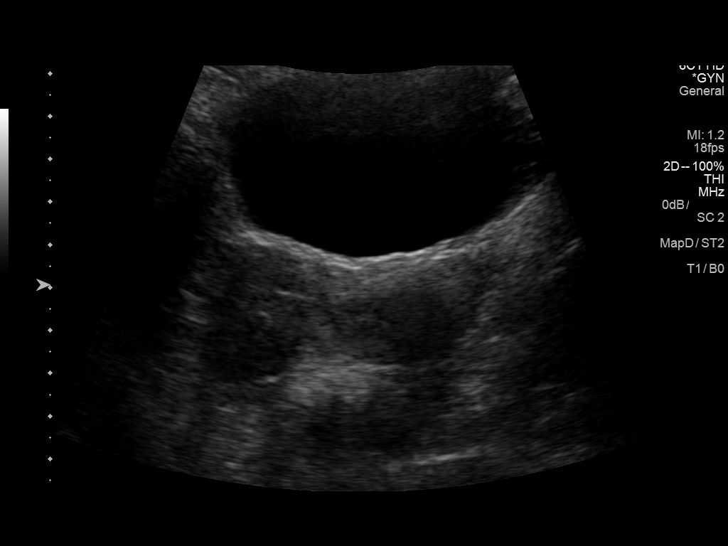
[im 7/39]
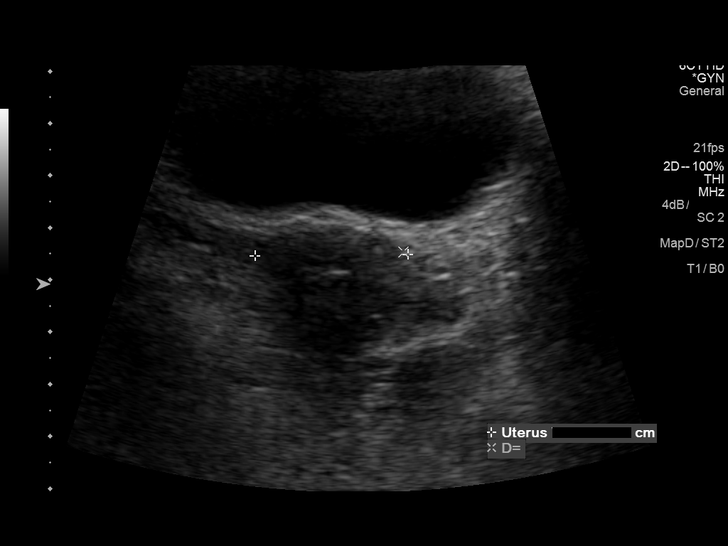
[im 10/39]
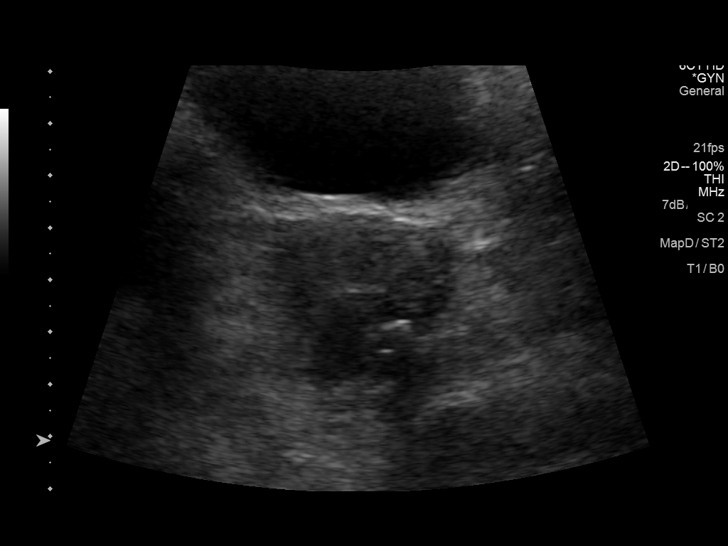
[im 13/39]
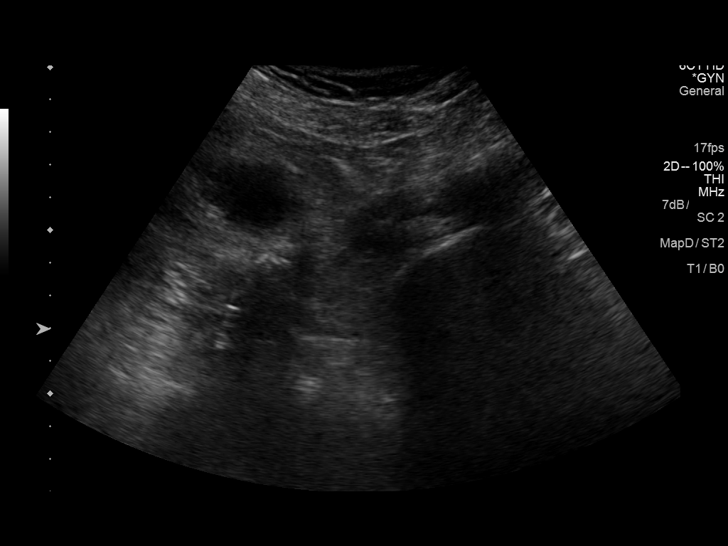
[im 16/39]
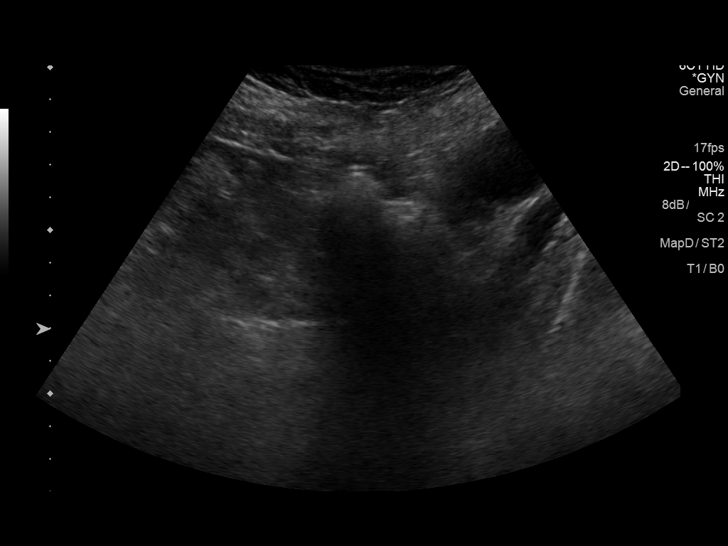
[im 20/39]
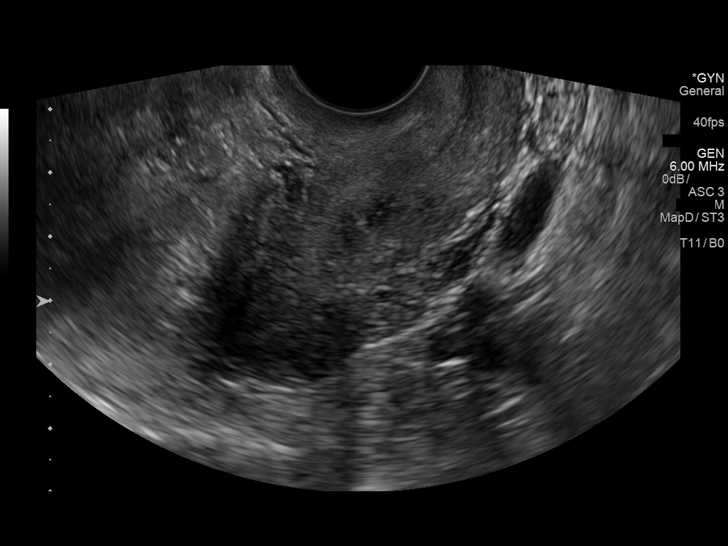
[im 23/39]
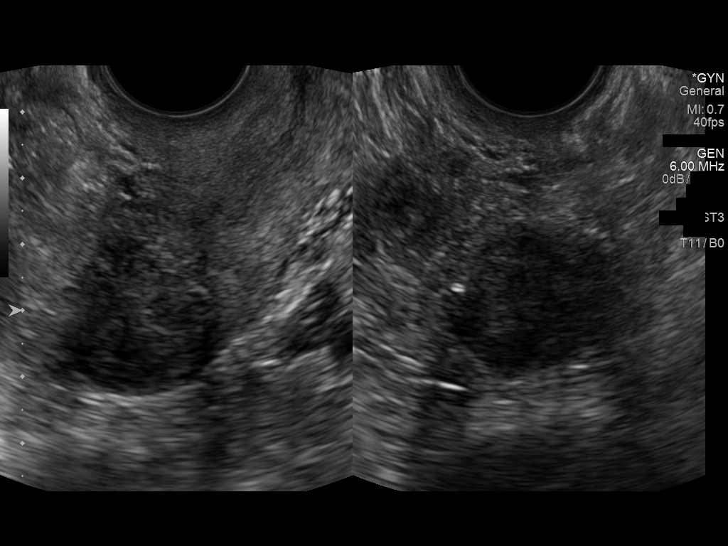
[im 26/39]
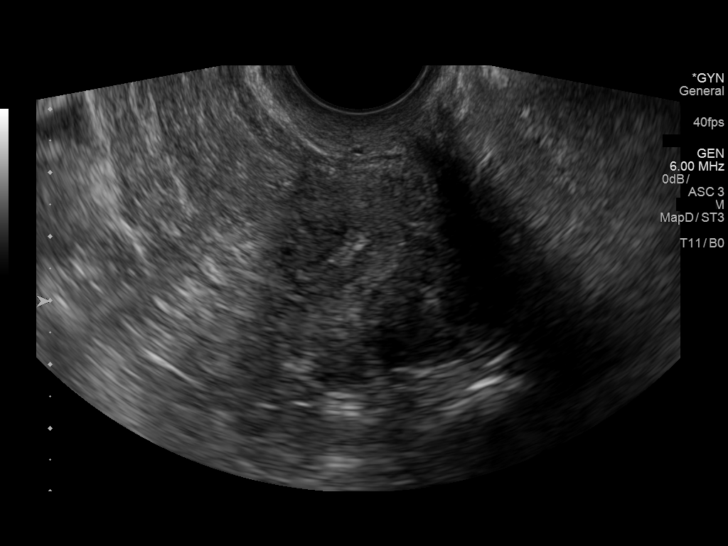
[im 29/39]
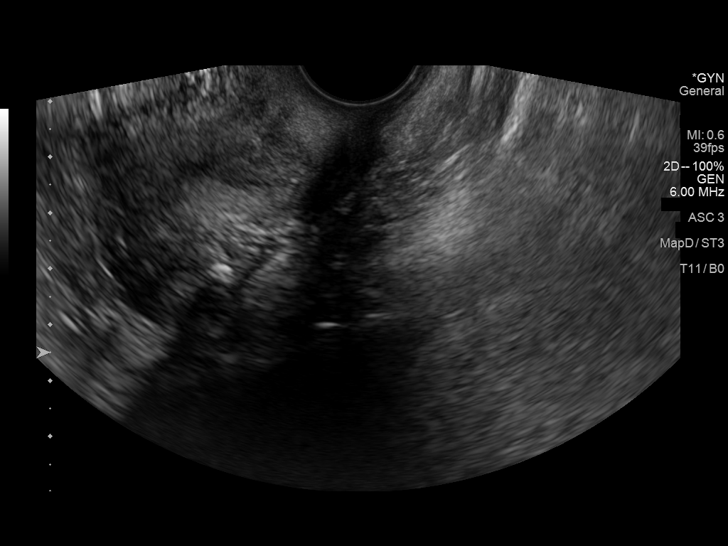
[im 32/39]
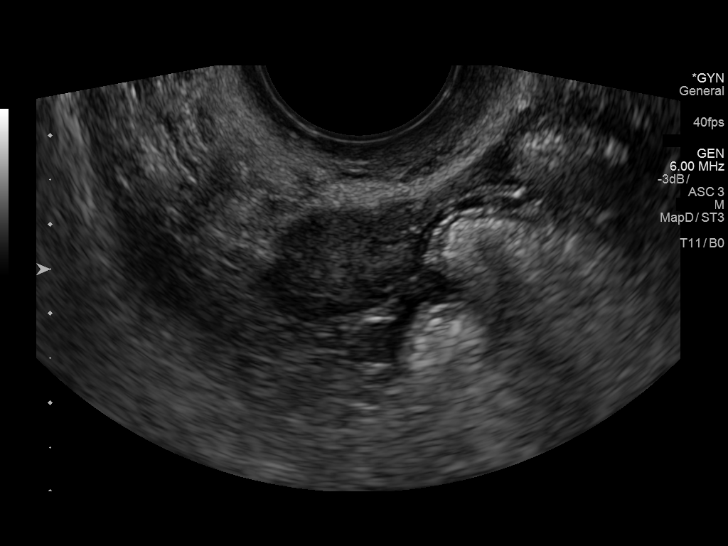
[im 35/39]
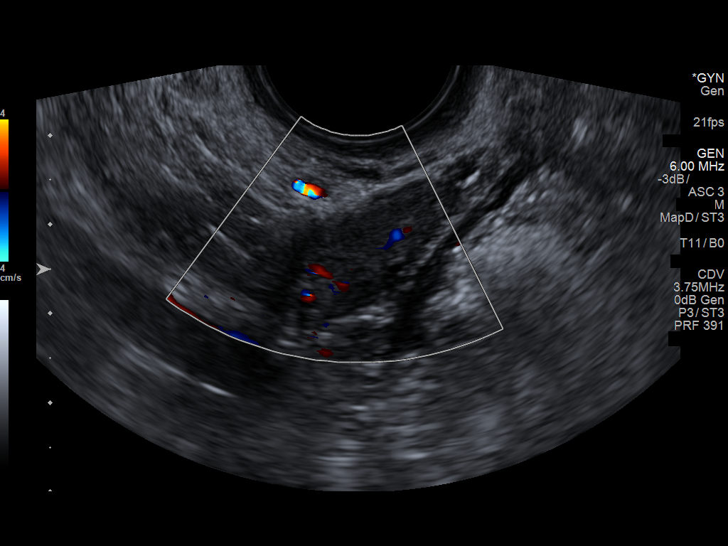
[im 39/39]
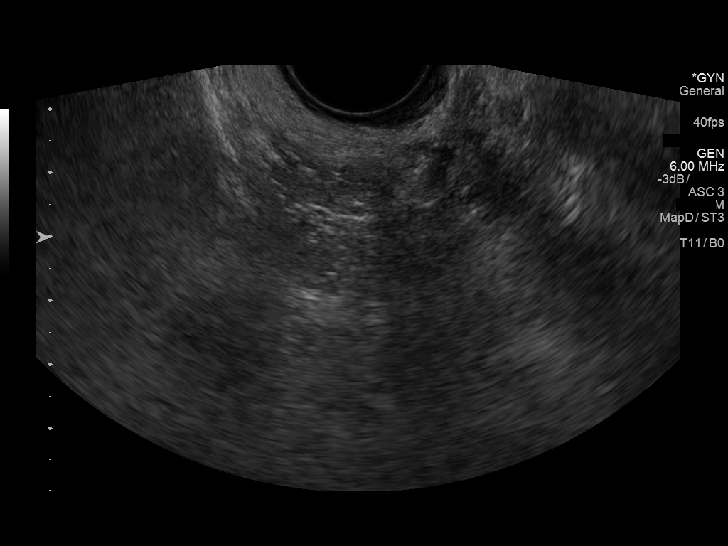

[13 of 25 positions shown; findings below may reference images not displayed]

FINDINGS: Uterus

Measurements: 6.3 x 1.8 x 2.9 cm. No fibroids or other mass
visualized.

Endometrium

Thickness: 3 mm. There is a 2.6 x 2.3 x 2.5 cm fibroid within the
posterior uterine body.

Right ovary

Measurements: 2.4 x 1.3 x 1.7 cm. Normal appearance/no adnexal mass.

Left ovary

Not visualized.

Other findings

No abnormal free fluid.
IMPRESSION: 1. Endometrium measures 3 mm. In the setting of post-menopausal
bleeding, this is consistent with a benign etiology such as
endometrial atrophy. If bleeding remains unresponsive to hormonal or
medical therapy, sonohysterogram should be considered for focal
lesion work-up. (Ref: Radiological Reasoning: Algorithmic Workup of
Abnormal Vaginal Bleeding with Endovaginal Sonography and
Sonohysterography. AJR [id number]; 191:S68-73)
2. Fibroid uterus.

## 2020-02-04 DIAGNOSIS — F332 Major depressive disorder, recurrent severe without psychotic features: Secondary | ICD-10-CM | POA: Diagnosis not present

## 2020-02-09 DIAGNOSIS — F332 Major depressive disorder, recurrent severe without psychotic features: Secondary | ICD-10-CM | POA: Diagnosis not present

## 2020-02-10 DIAGNOSIS — F4322 Adjustment disorder with anxiety: Secondary | ICD-10-CM | POA: Diagnosis not present

## 2020-02-12 DIAGNOSIS — F332 Major depressive disorder, recurrent severe without psychotic features: Secondary | ICD-10-CM | POA: Diagnosis not present

## 2020-02-16 DIAGNOSIS — F332 Major depressive disorder, recurrent severe without psychotic features: Secondary | ICD-10-CM | POA: Diagnosis not present

## 2020-02-18 DIAGNOSIS — F332 Major depressive disorder, recurrent severe without psychotic features: Secondary | ICD-10-CM | POA: Diagnosis not present

## 2020-02-23 DIAGNOSIS — F332 Major depressive disorder, recurrent severe without psychotic features: Secondary | ICD-10-CM | POA: Diagnosis not present

## 2020-02-24 DIAGNOSIS — D508 Other iron deficiency anemias: Secondary | ICD-10-CM | POA: Diagnosis not present

## 2020-02-24 DIAGNOSIS — R5382 Chronic fatigue, unspecified: Secondary | ICD-10-CM | POA: Diagnosis not present

## 2020-02-24 DIAGNOSIS — I10 Essential (primary) hypertension: Secondary | ICD-10-CM | POA: Diagnosis not present

## 2020-02-24 DIAGNOSIS — R399 Unspecified symptoms and signs involving the genitourinary system: Secondary | ICD-10-CM | POA: Diagnosis not present

## 2020-02-24 DIAGNOSIS — E559 Vitamin D deficiency, unspecified: Secondary | ICD-10-CM | POA: Diagnosis not present

## 2020-02-24 DIAGNOSIS — M1990 Unspecified osteoarthritis, unspecified site: Secondary | ICD-10-CM | POA: Diagnosis not present

## 2020-02-24 DIAGNOSIS — R110 Nausea: Secondary | ICD-10-CM | POA: Diagnosis not present

## 2020-02-24 DIAGNOSIS — F4323 Adjustment disorder with mixed anxiety and depressed mood: Secondary | ICD-10-CM | POA: Diagnosis not present

## 2020-02-25 DIAGNOSIS — F332 Major depressive disorder, recurrent severe without psychotic features: Secondary | ICD-10-CM | POA: Diagnosis not present

## 2020-03-01 DIAGNOSIS — F332 Major depressive disorder, recurrent severe without psychotic features: Secondary | ICD-10-CM | POA: Diagnosis not present

## 2020-03-03 DIAGNOSIS — F332 Major depressive disorder, recurrent severe without psychotic features: Secondary | ICD-10-CM | POA: Diagnosis not present

## 2020-03-08 DIAGNOSIS — F332 Major depressive disorder, recurrent severe without psychotic features: Secondary | ICD-10-CM | POA: Diagnosis not present

## 2020-03-08 DIAGNOSIS — I10 Essential (primary) hypertension: Secondary | ICD-10-CM | POA: Diagnosis not present

## 2020-03-08 DIAGNOSIS — R799 Abnormal finding of blood chemistry, unspecified: Secondary | ICD-10-CM | POA: Diagnosis not present

## 2020-03-09 DIAGNOSIS — F4322 Adjustment disorder with anxiety: Secondary | ICD-10-CM | POA: Diagnosis not present

## 2020-03-10 DIAGNOSIS — F332 Major depressive disorder, recurrent severe without psychotic features: Secondary | ICD-10-CM | POA: Diagnosis not present

## 2020-03-15 DIAGNOSIS — F332 Major depressive disorder, recurrent severe without psychotic features: Secondary | ICD-10-CM | POA: Diagnosis not present

## 2020-03-17 DIAGNOSIS — F332 Major depressive disorder, recurrent severe without psychotic features: Secondary | ICD-10-CM | POA: Diagnosis not present

## 2020-03-22 DIAGNOSIS — F332 Major depressive disorder, recurrent severe without psychotic features: Secondary | ICD-10-CM | POA: Diagnosis not present

## 2020-03-23 DIAGNOSIS — R634 Abnormal weight loss: Secondary | ICD-10-CM | POA: Diagnosis not present

## 2020-03-23 DIAGNOSIS — R110 Nausea: Secondary | ICD-10-CM | POA: Diagnosis not present

## 2020-03-23 DIAGNOSIS — R1010 Upper abdominal pain, unspecified: Secondary | ICD-10-CM | POA: Diagnosis not present

## 2020-03-24 ENCOUNTER — Encounter (HOSPITAL_BASED_OUTPATIENT_CLINIC_OR_DEPARTMENT_OTHER): Payer: Self-pay | Admitting: *Deleted

## 2020-03-24 ENCOUNTER — Other Ambulatory Visit: Payer: Self-pay

## 2020-03-24 ENCOUNTER — Emergency Department (HOSPITAL_BASED_OUTPATIENT_CLINIC_OR_DEPARTMENT_OTHER)
Admission: EM | Admit: 2020-03-24 | Discharge: 2020-03-24 | Disposition: A | Discharge status: Home / Self Care | Payer: BC Managed Care – PPO | Attending: Emergency Medicine | Admitting: Emergency Medicine

## 2020-03-24 DIAGNOSIS — R112 Nausea with vomiting, unspecified: Secondary | ICD-10-CM | POA: Diagnosis not present

## 2020-03-24 DIAGNOSIS — R110 Nausea: Secondary | ICD-10-CM | POA: Diagnosis not present

## 2020-03-24 DIAGNOSIS — E876 Hypokalemia: Secondary | ICD-10-CM | POA: Insufficient documentation

## 2020-03-24 DIAGNOSIS — R197 Diarrhea, unspecified: Secondary | ICD-10-CM | POA: Diagnosis not present

## 2020-03-24 DIAGNOSIS — Z87891 Personal history of nicotine dependence: Secondary | ICD-10-CM | POA: Diagnosis not present

## 2020-03-24 DIAGNOSIS — Z7951 Long term (current) use of inhaled steroids: Secondary | ICD-10-CM | POA: Diagnosis not present

## 2020-03-24 DIAGNOSIS — F410 Panic disorder [episodic paroxysmal anxiety] without agoraphobia: Secondary | ICD-10-CM | POA: Diagnosis not present

## 2020-03-24 DIAGNOSIS — R42 Dizziness and giddiness: Secondary | ICD-10-CM | POA: Diagnosis not present

## 2020-03-24 DIAGNOSIS — F4310 Post-traumatic stress disorder, unspecified: Secondary | ICD-10-CM | POA: Diagnosis not present

## 2020-03-24 DIAGNOSIS — F332 Major depressive disorder, recurrent severe without psychotic features: Secondary | ICD-10-CM | POA: Diagnosis not present

## 2020-03-24 DIAGNOSIS — J45909 Unspecified asthma, uncomplicated: Secondary | ICD-10-CM | POA: Diagnosis not present

## 2020-03-24 DIAGNOSIS — F341 Dysthymic disorder: Secondary | ICD-10-CM | POA: Diagnosis not present

## 2020-03-24 LAB — CBC
HCT: 39.1 % (ref 36.0–46.0)
Hemoglobin: 14.6 g/dL (ref 12.0–15.0)
MCH: 33.8 pg (ref 26.0–34.0)
MCHC: 37.3 g/dL — ABNORMAL HIGH (ref 30.0–36.0)
MCV: 90.5 fL (ref 80.0–100.0)
Platelets: 122 10*3/uL — ABNORMAL LOW (ref 150–400)
RBC: 4.32 MIL/uL (ref 3.87–5.11)
RDW: 12.8 % (ref 11.5–15.5)
WBC: 4.4 10*3/uL (ref 4.0–10.5)
nRBC: 0 % (ref 0.0–0.2)

## 2020-03-24 LAB — BASIC METABOLIC PANEL
Anion gap: 19 — ABNORMAL HIGH (ref 5–15)
BUN: 15 mg/dL (ref 6–20)
CO2: 22 mmol/L (ref 22–32)
Calcium: 9.4 mg/dL (ref 8.9–10.3)
Chloride: 90 mmol/L — ABNORMAL LOW (ref 98–111)
Creatinine, Ser: 0.98 mg/dL (ref 0.44–1.00)
GFR, Estimated: >60 mL/min (ref >60)
Glucose, Bld: 127 mg/dL — ABNORMAL HIGH (ref 70–99)
Potassium: 2.4 mmol/L — CL (ref 3.5–5.1)
Sodium: 131 mmol/L — ABNORMAL LOW (ref 135–145)

## 2020-03-24 LAB — MAGNESIUM: Magnesium: 2.1 mg/dL (ref 1.7–2.4)

## 2020-03-24 MED ORDER — LACTATED RINGERS IV BOLUS
1000.0000 mL | Freq: Once | INTRAVENOUS | Status: AC
  Administered 2020-03-24: 1000 mL via INTRAVENOUS

## 2020-03-24 MED ORDER — SODIUM CHLORIDE 0.9 % IV BOLUS
500.0000 mL | Freq: Once | INTRAVENOUS | Status: AC
  Administered 2020-03-24: 500 mL via INTRAVENOUS

## 2020-03-24 MED ORDER — POTASSIUM CHLORIDE 20 MEQ PO PACK: 40.0000 meq | PACK | Freq: Two times a day (BID) | ORAL | 0 refills | Status: DC

## 2020-03-24 MED ORDER — POTASSIUM CHLORIDE 20 MEQ PO PACK
40.0000 meq | PACK | Freq: Once | ORAL | Status: AC
  Administered 2020-03-24: 40 meq via ORAL
  Filled 2020-03-24: qty 2

## 2020-03-24 MED ORDER — POTASSIUM CHLORIDE 10 MEQ/100ML IV SOLN
10.0000 meq | Freq: Once | INTRAVENOUS | Status: AC
  Administered 2020-03-24: 10 meq via INTRAVENOUS
  Filled 2020-03-24: qty 100

## 2020-03-24 NOTE — ED Provider Notes (Addendum)
[facility] EMERGENCY DEPARTMENT Provider Note   CSN: [id number] Arrival date & time: 03/24/20  1559     History Chief Complaint  Patient presents with  . Hypokalemia    Maria Burnett is a 49 y.o. female.  HPI 49 year old female presents with hypokalemia and feeling dehydrated.  She has been having vomiting and nausea for 9 weeks.  Was referred to gastroenterology who did labs yesterday and her potassium came back at 2.7.  She was called in potassium but states she never tolerates oral potassium tablets well and it causes diarrhea and increased nausea.  She did try one last night but had diarrhea and the nausea.  There is no abdominal pain.  Feels generally weak and like she needs fluids.  She was told to come to the ER because potassium can affect her heart.  Otherwise whenever she is taking the meds she has been prescribed such as Phenergan and Zofran the nausea seems to be under control.   Past Medical History:  Diagnosis Date  . Anxiety   . Asthma   . Depression   . Migraine   . Perimenopause    "premature ovarian failure"  . PTSD (post-traumatic stress disorder)   . Vertigo     Patient Active Problem List   Diagnosis Date Noted  . Chronic migraine 06/27/2016  . Vertigo 06/27/2016  . Paresthesia 06/27/2016    Past Surgical History:  Procedure Laterality Date  . HIATAL HERNIA REPAIR    . OVARIAN CYST REMOVAL       OB History   No obstetric history on file.     Family History  Problem Relation Age of Onset  . Hypertension Mother   . Lung disease Father   . Alcoholism Father   . Hyperlipidemia Father   . Breast cancer Sister     Social History   Tobacco Use  . Smoking status: Former Games developer  . Smokeless tobacco: Never Used  . Tobacco comment: Not smoked since her 17s - only 2 cigarettes per day for about one year.  Substance Use Topics  . Alcohol use: Yes    Comment: Social - 1-2 glasses of wine   . Drug use: No    Home  Medications Prior to Admission medications   Medication Sig Start Date End Date Taking? Authorizing Provider  Albuterol Sulfate (VENTOLIN HFA IN) Inhale into the lungs as needed.    [provider]  B Complex Vitamins (VITAMIN B COMPLEX PO) Take by mouth.    [provider]  Budesonide-Formoterol Fumarate (SYMBICORT IN) Inhale into the lungs.    [provider]  clonazePAM (KLONOPIN) 0.5 MG tablet Take 0.5 mg by mouth 2 (two) times daily as needed. 06/24/16   [provider]  hydrochlorothiazide (HYDRODIURIL) 25 MG tablet Take 25 mg by mouth daily.    [provider]  Multiple Vitamins-Minerals (MULTIVITAMIN PO) Take by mouth daily.    [provider]  ondansetron (ZOFRAN) 8 MG tablet Take 8 mg by mouth every 8 (eight) hours as needed for nausea or vomiting.    [provider]  potassium chloride (KLOR-CON) 20 MEQ packet Take 40 mEq by mouth 2 (two) times daily for 5 days. 03/24/20 03/29/20  Pricilla Loveless, MD  promethazine (PHENERGAN) 25 MG tablet Take 25 mg by mouth every 4 (four) hours as needed. 06/16/16   [provider]  SUMAtriptan (IMITREX) 100 MG tablet TAKE 1 TAB BY MOUTH AT THE ONSET OF HEADACHE. MAY REPEAT IN 2  HOURS IF NEEDED. MAX OF 200MG  PER DAY 03/29/16   [provider]  SUMAtriptan Succinate (ZEMBRACE SYMTOUCH) 3 MG/0.5ML SOAJ Inject into the skin as needed.    [provider]  topiramate (TOPAMAX) 100 MG tablet Take 1 tablet (100 mg total) by mouth 2 (two) times daily. 06/27/16   06/29/16, MD    Allergies    Prednisone  Review of Systems   Review of Systems  Constitutional: Positive for unexpected weight change (due to vomiting, decreased PO).  Gastrointestinal: Positive for nausea and vomiting. Negative for abdominal pain.  Neurological: Negative for light-headedness.  All other systems reviewed and are negative.   Physical Exam Updated Vital Signs BP 120/87   Pulse (!) 104    Temp 98.7 F (37.1 C) (Oral)   Resp 20   Ht 5\' 1"  (1.549 m)   Wt 53.1 kg   SpO2 100%   BMI 22.11 kg/m   Physical Exam Vitals and nursing note reviewed.  Constitutional:      General: She is not in acute distress.    Appearance: She is well-developed and well-nourished. She is not ill-appearing or diaphoretic.  HENT:     Head: Normocephalic and atraumatic.     Right Ear: External ear normal.     Left Ear: External ear normal.     Nose: Nose normal.  Eyes:     General:        Right eye: No discharge.        Left eye: No discharge.  Cardiovascular:     Rate and Rhythm: Normal rate and regular rhythm.     Heart sounds: Normal heart sounds.  Pulmonary:     Effort: Pulmonary effort is normal.     Breath sounds: Normal breath sounds.  Abdominal:     Palpations: Abdomen is soft.     Tenderness: There is no abdominal tenderness.  Skin:    General: Skin is warm and dry.  Neurological:     Mental Status: She is alert.  Psychiatric:        Mood and Affect: Mood is not anxious.     ED Results / Procedures / Treatments   Labs (all labs ordered are listed, but only abnormal results are displayed) Labs Reviewed  CBC - Abnormal; Notable for the following components:      Result Value   MCHC 37.3 (*)    Platelets 122 (*)    All other components within normal limits  BASIC METABOLIC PANEL - Abnormal; Notable for the following components:   Sodium 131 (*)    Potassium 2.4 (*)    Chloride 90 (*)    Glucose, Bld 127 (*)    Anion gap 19 (*)    All other components within normal limits  MAGNESIUM    EKG EKG Interpretation  Date/Time:  Wednesday March 24 2020 16:19:35 EST Ventricular Rate:  96 PR Interval:    QRS Duration: 81 QT Interval:  365 QTC Calculation: 462 R Axis:   71 Text Interpretation: Sinus rhythm Consider left atrial enlargement No old tracing to compare Confirmed by Sunday [phone]) on 03/24/2020 4:35:32 PM   Radiology No results  found.  Procedures Procedures (including critical care time)  Medications Ordered in ED Medications  potassium chloride 10 mEq in 100 mL IVPB (10 mEq Intravenous New Bag/Given 03/24/20 1808)  potassium chloride 10 mEq in 100 mL IVPB (0 mEq Intravenous Stopped 03/24/20 1806)  lactated ringers bolus 1,000 mL (0 mLs Intravenous Stopped 03/24/20  1811)  potassium chloride (KLOR-CON) packet 40 mEq (40 mEq Oral Given 03/24/20 1703)  sodium chloride 0.9 % bolus 500 mL (500 mLs Intravenous New Bag/Given 03/24/20 1816)    ED Course  I have reviewed the triage vital signs and the nursing notes.  Pertinent labs & imaging results that were available during my care of the patient were reviewed by me and considered in my medical decision making (see chart for details).    MDM Rules/Calculators/A&P                          Patient's lab work has been reviewed and shows hypokalemia to 2.4.  Magnesium is okay.  Other labs unremarkable besides mild hyponatremia.  She was given a bolus of fluid, 2 rounds of IV potassium and oral potassium.  She does not do well with the pills but the packet did fine.  She feels like now that she has the second dose of nausea medicine at home from her GI, she will be able to tolerate p.o. much better and replete her potassium through diet in addition to medicine.  No obvious or significant ECG changes. She is on HCTZ for Mnire disease but has not taken it today.  She will be instructed to stop this as this is not helping her potassium.  At this point I think she is stable for discharge home with return precautions.  Needs recheck by PCP. Final Clinical Impression(s) / ED Diagnoses Final diagnoses:  Hypokalemia    Rx / DC Orders ED Discharge Orders         Ordered    potassium chloride (KLOR-CON) 20 MEQ packet  2 times daily        03/24/20 1834           Pricilla Loveless, MD 03/24/20 Maria Band, MD 03/24/20 [phone]

## 2020-03-24 NOTE — ED Notes (Signed)
Placed on cont cardiac monitoring with cont POX and int NBP 

## 2020-03-24 NOTE — ED Notes (Signed)
Pt states she has lost a lot of weight, unable to tolerate PO solids, has been taking PO Zofran, with relief, but still not drinking and eating a lot, her primary MD prescribed Phenergan PO which states is helping alot

## 2020-03-24 NOTE — ED Triage Notes (Signed)
Pt was referred to the ED by her Primary Care MD secondary to a Potassium Level of 2.7 that was obtained her MD office

## 2020-03-24 NOTE — Discharge Instructions (Addendum)
Do not take your hydrochlorothiazide until cleared by your doctor.

## 2020-03-29 DIAGNOSIS — F332 Major depressive disorder, recurrent severe without psychotic features: Secondary | ICD-10-CM | POA: Diagnosis not present

## 2020-03-29 DIAGNOSIS — E876 Hypokalemia: Secondary | ICD-10-CM | POA: Diagnosis not present

## 2020-03-31 DIAGNOSIS — F332 Major depressive disorder, recurrent severe without psychotic features: Secondary | ICD-10-CM | POA: Diagnosis not present

## 2020-03-31 DIAGNOSIS — R197 Diarrhea, unspecified: Secondary | ICD-10-CM | POA: Diagnosis not present

## 2020-04-07 DIAGNOSIS — F332 Major depressive disorder, recurrent severe without psychotic features: Secondary | ICD-10-CM | POA: Diagnosis not present

## 2020-04-08 DIAGNOSIS — F4323 Adjustment disorder with mixed anxiety and depressed mood: Secondary | ICD-10-CM | POA: Diagnosis not present

## 2020-04-09 DIAGNOSIS — F332 Major depressive disorder, recurrent severe without psychotic features: Secondary | ICD-10-CM | POA: Diagnosis not present

## 2020-04-12 DIAGNOSIS — F332 Major depressive disorder, recurrent severe without psychotic features: Secondary | ICD-10-CM | POA: Diagnosis not present

## 2020-04-14 DIAGNOSIS — F332 Major depressive disorder, recurrent severe without psychotic features: Secondary | ICD-10-CM | POA: Diagnosis not present

## 2020-04-15 DIAGNOSIS — Z20828 Contact with and (suspected) exposure to other viral communicable diseases: Secondary | ICD-10-CM | POA: Diagnosis not present

## 2020-04-15 DIAGNOSIS — Z20822 Contact with and (suspected) exposure to covid-19: Secondary | ICD-10-CM | POA: Diagnosis not present

## 2020-04-21 DIAGNOSIS — F332 Major depressive disorder, recurrent severe without psychotic features: Secondary | ICD-10-CM | POA: Diagnosis not present

## 2020-04-23 DIAGNOSIS — Z9889 Other specified postprocedural states: Secondary | ICD-10-CM | POA: Diagnosis not present

## 2020-04-23 DIAGNOSIS — R110 Nausea: Secondary | ICD-10-CM | POA: Diagnosis not present

## 2020-04-23 DIAGNOSIS — K2950 Unspecified chronic gastritis without bleeding: Secondary | ICD-10-CM | POA: Diagnosis not present

## 2020-04-23 DIAGNOSIS — K219 Gastro-esophageal reflux disease without esophagitis: Secondary | ICD-10-CM | POA: Diagnosis not present

## 2020-04-26 DIAGNOSIS — F332 Major depressive disorder, recurrent severe without psychotic features: Secondary | ICD-10-CM | POA: Diagnosis not present

## 2020-04-27 DIAGNOSIS — R634 Abnormal weight loss: Secondary | ICD-10-CM | POA: Diagnosis not present

## 2020-04-27 DIAGNOSIS — F4323 Adjustment disorder with mixed anxiety and depressed mood: Secondary | ICD-10-CM | POA: Diagnosis not present

## 2020-04-27 DIAGNOSIS — R140 Abdominal distension (gaseous): Secondary | ICD-10-CM | POA: Diagnosis not present

## 2020-04-27 DIAGNOSIS — R110 Nausea: Secondary | ICD-10-CM | POA: Diagnosis not present

## 2020-04-27 DIAGNOSIS — E876 Hypokalemia: Secondary | ICD-10-CM | POA: Diagnosis not present

## 2020-04-28 DIAGNOSIS — F332 Major depressive disorder, recurrent severe without psychotic features: Secondary | ICD-10-CM | POA: Diagnosis not present

## 2020-05-03 DIAGNOSIS — F332 Major depressive disorder, recurrent severe without psychotic features: Secondary | ICD-10-CM | POA: Diagnosis not present

## 2020-05-05 DIAGNOSIS — F332 Major depressive disorder, recurrent severe without psychotic features: Secondary | ICD-10-CM | POA: Diagnosis not present

## 2020-05-06 DIAGNOSIS — R110 Nausea: Secondary | ICD-10-CM | POA: Diagnosis not present

## 2020-05-06 DIAGNOSIS — R112 Nausea with vomiting, unspecified: Secondary | ICD-10-CM | POA: Diagnosis not present

## 2020-05-12 DIAGNOSIS — F332 Major depressive disorder, recurrent severe without psychotic features: Secondary | ICD-10-CM | POA: Diagnosis not present

## 2020-05-14 DIAGNOSIS — F332 Major depressive disorder, recurrent severe without psychotic features: Secondary | ICD-10-CM | POA: Diagnosis not present

## 2020-05-18 DIAGNOSIS — F4323 Adjustment disorder with mixed anxiety and depressed mood: Secondary | ICD-10-CM | POA: Diagnosis not present

## 2020-05-19 DIAGNOSIS — F332 Major depressive disorder, recurrent severe without psychotic features: Secondary | ICD-10-CM | POA: Diagnosis not present

## 2020-05-21 DIAGNOSIS — F332 Major depressive disorder, recurrent severe without psychotic features: Secondary | ICD-10-CM | POA: Diagnosis not present

## 2020-05-31 DIAGNOSIS — F332 Major depressive disorder, recurrent severe without psychotic features: Secondary | ICD-10-CM | POA: Diagnosis not present

## 2020-06-02 DIAGNOSIS — F332 Major depressive disorder, recurrent severe without psychotic features: Secondary | ICD-10-CM | POA: Diagnosis not present

## 2020-06-03 DIAGNOSIS — H16223 Keratoconjunctivitis sicca, not specified as Sjogren's, bilateral: Secondary | ICD-10-CM | POA: Diagnosis not present

## 2020-06-03 DIAGNOSIS — H04123 Dry eye syndrome of bilateral lacrimal glands: Secondary | ICD-10-CM | POA: Diagnosis not present

## 2020-06-04 DIAGNOSIS — F332 Major depressive disorder, recurrent severe without psychotic features: Secondary | ICD-10-CM | POA: Diagnosis not present

## 2020-06-05 DIAGNOSIS — F332 Major depressive disorder, recurrent severe without psychotic features: Secondary | ICD-10-CM | POA: Diagnosis not present

## 2020-06-07 DIAGNOSIS — F332 Major depressive disorder, recurrent severe without psychotic features: Secondary | ICD-10-CM | POA: Diagnosis not present

## 2020-06-09 DIAGNOSIS — F332 Major depressive disorder, recurrent severe without psychotic features: Secondary | ICD-10-CM | POA: Diagnosis not present

## 2020-06-10 DIAGNOSIS — F332 Major depressive disorder, recurrent severe without psychotic features: Secondary | ICD-10-CM | POA: Diagnosis not present

## 2020-06-15 DIAGNOSIS — F332 Major depressive disorder, recurrent severe without psychotic features: Secondary | ICD-10-CM | POA: Diagnosis not present

## 2020-06-16 DIAGNOSIS — F332 Major depressive disorder, recurrent severe without psychotic features: Secondary | ICD-10-CM | POA: Diagnosis not present

## 2020-06-21 DIAGNOSIS — F332 Major depressive disorder, recurrent severe without psychotic features: Secondary | ICD-10-CM | POA: Diagnosis not present

## 2020-06-22 DIAGNOSIS — F332 Major depressive disorder, recurrent severe without psychotic features: Secondary | ICD-10-CM | POA: Diagnosis not present

## 2020-06-23 DIAGNOSIS — F332 Major depressive disorder, recurrent severe without psychotic features: Secondary | ICD-10-CM | POA: Diagnosis not present

## 2020-06-30 DIAGNOSIS — F332 Major depressive disorder, recurrent severe without psychotic features: Secondary | ICD-10-CM | POA: Diagnosis not present

## 2020-07-02 DIAGNOSIS — Z0000 Encounter for general adult medical examination without abnormal findings: Secondary | ICD-10-CM | POA: Diagnosis not present

## 2020-07-02 DIAGNOSIS — R5382 Chronic fatigue, unspecified: Secondary | ICD-10-CM | POA: Diagnosis not present

## 2020-07-02 DIAGNOSIS — D508 Other iron deficiency anemias: Secondary | ICD-10-CM | POA: Diagnosis not present

## 2020-07-02 DIAGNOSIS — I10 Essential (primary) hypertension: Secondary | ICD-10-CM | POA: Diagnosis not present

## 2020-07-02 DIAGNOSIS — E559 Vitamin D deficiency, unspecified: Secondary | ICD-10-CM | POA: Diagnosis not present

## 2020-07-02 DIAGNOSIS — Z13220 Encounter for screening for lipoid disorders: Secondary | ICD-10-CM | POA: Diagnosis not present

## 2020-07-09 ENCOUNTER — Telehealth: Payer: Self-pay | Admitting: Internal Medicine

## 2020-07-09 NOTE — Telephone Encounter (Signed)
Received a new hem referral from Dr. Hyman Hopes for thrombocytopenia. Maria Burnett has been cld and scheduled to see Dr. Arbutus Ped on 4/12 at 11:45am w/labs at 11:15am. Pt aware to arrive 20 min early.

## 2020-07-12 ENCOUNTER — Other Ambulatory Visit: Payer: Self-pay | Admitting: Internal Medicine

## 2020-07-12 ENCOUNTER — Encounter: Payer: Self-pay | Admitting: Medical Oncology

## 2020-07-12 DIAGNOSIS — D539 Nutritional anemia, unspecified: Secondary | ICD-10-CM

## 2020-07-12 DIAGNOSIS — F332 Major depressive disorder, recurrent severe without psychotic features: Secondary | ICD-10-CM | POA: Diagnosis not present

## 2020-07-12 DIAGNOSIS — R634 Abnormal weight loss: Secondary | ICD-10-CM | POA: Diagnosis not present

## 2020-07-12 DIAGNOSIS — R6881 Early satiety: Secondary | ICD-10-CM | POA: Diagnosis not present

## 2020-07-12 DIAGNOSIS — R110 Nausea: Secondary | ICD-10-CM | POA: Diagnosis not present

## 2020-07-13 ENCOUNTER — Inpatient Hospital Stay: Payer: BC Managed Care – PPO

## 2020-07-13 ENCOUNTER — Inpatient Hospital Stay: Payer: BC Managed Care – PPO | Attending: Internal Medicine | Admitting: Internal Medicine

## 2020-07-13 ENCOUNTER — Other Ambulatory Visit: Payer: Self-pay

## 2020-07-13 ENCOUNTER — Telehealth: Payer: Self-pay | Admitting: Internal Medicine

## 2020-07-13 ENCOUNTER — Encounter: Payer: Self-pay | Admitting: Internal Medicine

## 2020-07-13 VITALS — BP 130/91 | HR 102 | Temp 98.0°F | Resp 19 | Ht 61.0 in | Wt 110.0 lb

## 2020-07-13 DIAGNOSIS — D708 Other neutropenia: Secondary | ICD-10-CM

## 2020-07-13 DIAGNOSIS — Z87891 Personal history of nicotine dependence: Secondary | ICD-10-CM

## 2020-07-13 DIAGNOSIS — D72819 Decreased white blood cell count, unspecified: Secondary | ICD-10-CM | POA: Insufficient documentation

## 2020-07-13 DIAGNOSIS — D709 Neutropenia, unspecified: Secondary | ICD-10-CM

## 2020-07-13 DIAGNOSIS — D539 Nutritional anemia, unspecified: Secondary | ICD-10-CM

## 2020-07-13 DIAGNOSIS — D696 Thrombocytopenia, unspecified: Secondary | ICD-10-CM | POA: Diagnosis not present

## 2020-07-13 DIAGNOSIS — R197 Diarrhea, unspecified: Secondary | ICD-10-CM

## 2020-07-13 DIAGNOSIS — E876 Hypokalemia: Secondary | ICD-10-CM | POA: Diagnosis not present

## 2020-07-13 DIAGNOSIS — K859 Acute pancreatitis without necrosis or infection, unspecified: Secondary | ICD-10-CM

## 2020-07-13 DIAGNOSIS — Z803 Family history of malignant neoplasm of breast: Secondary | ICD-10-CM

## 2020-07-13 LAB — CMP (CANCER CENTER ONLY)
ALT: 22 U/L (ref 0–44)
AST: 23 U/L (ref 15–41)
Albumin: 4.6 g/dL (ref 3.5–5.0)
Alkaline Phosphatase: 52 U/L (ref 38–126)
Anion gap: 11 (ref 5–15)
BUN: 15 mg/dL (ref 6–20)
CO2: 23 mmol/L (ref 22–32)
Calcium: 9.4 mg/dL (ref 8.9–10.3)
Chloride: 102 mmol/L (ref 98–111)
Creatinine: 0.89 mg/dL (ref 0.44–1.00)
GFR, Estimated: >60 mL/min (ref >60)
Glucose, Bld: 137 mg/dL — ABNORMAL HIGH (ref 70–99)
Potassium: 3.1 mmol/L — ABNORMAL LOW (ref 3.5–5.1)
Sodium: 136 mmol/L (ref 135–145)
Total Bilirubin: 0.6 mg/dL (ref 0.3–1.2)
Total Protein: 7.1 g/dL (ref 6.5–8.1)

## 2020-07-13 LAB — CBC WITH DIFFERENTIAL (CANCER CENTER ONLY)
Abs Immature Granulocytes: 0.01 10*3/uL (ref 0.00–0.07)
Basophils Absolute: 0 10*3/uL (ref 0.0–0.1)
Basophils Relative: 1 %
Eosinophils Absolute: 0.1 10*3/uL (ref 0.0–0.5)
Eosinophils Relative: 3 %
HCT: 42 % (ref 36.0–46.0)
Hemoglobin: 14.3 g/dL (ref 12.0–15.0)
Immature Granulocytes: 0 %
Lymphocytes Relative: 20 %
Lymphs Abs: 0.7 10*3/uL (ref 0.7–4.0)
MCH: 31.3 pg (ref 26.0–34.0)
MCHC: 34 g/dL (ref 30.0–36.0)
MCV: 91.9 fL (ref 80.0–100.0)
Monocytes Absolute: 0.3 10*3/uL (ref 0.1–1.0)
Monocytes Relative: 8 %
Neutro Abs: 2.3 10*3/uL (ref 1.7–7.7)
Neutrophils Relative %: 68 %
Platelet Count: 82 10*3/uL — ABNORMAL LOW (ref 150–400)
RBC: 4.57 MIL/uL (ref 3.87–5.11)
RDW: 13 % (ref 11.5–15.5)
WBC Count: 3.3 10*3/uL — ABNORMAL LOW (ref 4.0–10.5)
nRBC: 0 % (ref 0.0–0.2)

## 2020-07-13 LAB — IRON AND TIBC
Iron: 166 ug/dL — ABNORMAL HIGH (ref 41–142)
Saturation Ratios: 54 % (ref 21–57)
TIBC: 309 ug/dL (ref 236–444)
UIBC: 143 ug/dL (ref 120–384)

## 2020-07-13 LAB — LACTATE DEHYDROGENASE: LDH: 124 U/L (ref 98–192)

## 2020-07-13 LAB — VITAMIN B12: Vitamin B-12: 1036 pg/mL — ABNORMAL HIGH (ref 180–914)

## 2020-07-13 LAB — HEPATITIS PANEL, ACUTE
HCV Ab: NONREACTIVE
Hep A IgM: NONREACTIVE
Hep B C IgM: NONREACTIVE
Hepatitis B Surface Ag: NONREACTIVE

## 2020-07-13 LAB — FERRITIN: Ferritin: 289 ng/mL (ref 11–307)

## 2020-07-13 LAB — HIV ANTIBODY (ROUTINE TESTING W REFLEX): HIV Screen 4th Generation wRfx: NONREACTIVE

## 2020-07-13 LAB — FOLATE: Folate: >117 ng/mL (ref >5.9)

## 2020-07-13 NOTE — Telephone Encounter (Signed)
Scheduled follow-up appointment per 4/12 los. Patient is aware. 

## 2020-07-13 NOTE — Progress Notes (Signed)
[place] [phone]   Fax:([phone]  CONSULT NOTE  REFERRING PHYSICIAN: Dr. Maurice Small  REASON FOR CONSULTATION:  50 years old white female presented with thrombocytopenia and leukocytopenia.  HPI Rosalynn Sergent is a 50 y.o. female with past medical history significant for anxiety, depression, asthma, migraine, PTSD, vertigo as well as hiatal hernia repair.  The patient mentioned that 3 weeks before Thanksgiving she started having intermittent nausea and weight loss.  She was seen by gastroenterology complaining of the nausea weight loss and lack of appetite as well as lack of energy.  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  She also had endoscopy as well as ultrasound of the abdomen and HIDA scan that were unremarkable.  She is scheduled to have CT scan of the abdomen for further evaluation of her condition.  She is followed by gastroenterology at [facility].  She was seen recently by her primary care physician Dr. Justin Mend and blood work showed low white blood count of 2.4 and platelets count of 93,000.  The patient was referred to me today for further evaluation and recommendation regarding her bicytopenia. When seen today she continues to have intermittent diarrhea.  She was admitted to the hospital many years ago after blood transfusion with pneumonia and ARDS requiring ventilation.  She lost around 32 pound since Thanksgiving.  She denied having any current fever or chills.  She has no chest pain, shortness of breath, cough or hemoptysis.  She denied having any headache or visual changes. Family history significant for mother with hypertension, father had lung disease and died from alcoholic cirrhosis.  Sister had breast cancer twice at age 19 and 84. The patient is divorced and has no children.  She is currently unable to  work and used to work as a Surveyor, mining.  She has no history of smoking, alcohol or drug abuse. HPI  Past Medical History:  Diagnosis Date  . Anxiety   . Asthma   . Depression   . Migraine   . Perimenopause    "premature ovarian failure"  . PTSD (post-traumatic stress disorder)   . Vertigo     Past Surgical History:  Procedure Laterality Date  . HIATAL HERNIA REPAIR    . OVARIAN CYST REMOVAL      Family History  Problem Relation Age of Onset  . Hypertension Mother   . Lung disease Father   . Alcoholism Father   . Hyperlipidemia Father   . Breast cancer Sister     Social History Social History   Tobacco Use  . Smoking status: Former Research scientist (life sciences)  . Smokeless tobacco: Never Used  . Tobacco comment: Not smoked since her 44s - only 2 cigarettes per day for about one year.  Substance Use Topics  . Alcohol use: Yes    Comment: Social - 1-2 glasses of wine   . Drug use: No    Allergies  Allergen Reactions  . Other Other (See Comments)    Propanol-worsens asthma  . Prednisone Other (See Comments)    Blurred vision  . Propranolol Hcl Other (See Comments)    Current Outpatient Medications  Medication Sig Dispense Refill  . Albuterol Sulfate, sensor, (PROAIR DIGIHALER) 108 (90 Base) MCG/ACT AEPB Inhale into the lungs.    . Boric Acid CRYS 600 mg as directed    . Budesonide-Formoterol Fumarate (SYMBICORT IN) Inhale into  the lungs.    . cetirizine (ZYRTEC) 10 MG tablet Take by mouth.    . clonazePAM (KLONOPIN) 0.5 MG tablet Take 0.5 mg by mouth 2 (two) times daily as needed.  0  . CREON 36000-114000 units CPEP capsule Take 36,000 Units by mouth 4 (four) times daily.    . cycloSPORINE (RESTASIS) 0.05 % ophthalmic emulsion 1 drop into affected eye    . Esketamine HCl, 56 MG Dose, (SPRAVATO, 56 MG DOSE,) 28 MG/DEVICE SOPK Place into the nose.    . hydrochlorothiazide (HYDRODIURIL) 12.5 MG tablet Take 12.5 mg by mouth daily.    . Multiple Vitamins-Minerals  (MULTIVITAMIN PO) Take by mouth daily.    . ondansetron (ZOFRAN) 8 MG tablet Take 8 mg by mouth every 8 (eight) hours as needed for nausea or vomiting.    . promethazine (PHENERGAN) 25 MG tablet Take 25 mg by mouth every 4 (four) hours as needed.    . topiramate (TOPAMAX) 200 MG tablet Take 200 mg by mouth at bedtime.    . vortioxetine HBr (TRINTELLIX) 20 MG TABS tablet      No current facility-administered medications for this visit.    Review of Systems  Constitutional: positive for anorexia, fatigue and weight loss Eyes: negative Ears, nose, mouth, throat, and face: negative Respiratory: negative Cardiovascular: negative Gastrointestinal: positive for diarrhea and nausea Genitourinary:negative Integument/breast: negative Hematologic/lymphatic: negative Musculoskeletal:negative Neurological: negative Behavioral/Psych: negative Endocrine: negative Allergic/Immunologic: negative  Physical Exam  JGO:TLXBW, healthy, no distress, well nourished, well developed and anxious SKIN: skin color, texture, turgor are normal, no rashes or significant lesions HEAD: Normocephalic, No masses, lesions, tenderness or abnormalities EYES: normal, PERRLA, Conjunctiva are pink and non-injected EARS: External ears normal, Canals clear OROPHARYNX:no exudate, no erythema and lips, buccal mucosa, and tongue normal  NECK: supple, no adenopathy, no JVD LYMPH:  no palpable lymphadenopathy, no hepatosplenomegaly BREAST:not examined LUNGS: clear to auscultation , and palpation HEART: regular rate & rhythm, no murmurs and no gallops ABDOMEN:abdomen soft, non-tender, normal bowel sounds and no masses or organomegaly BACK: No CVA tenderness, Range of motion is normal EXTREMITIES:no joint deformities, effusion, or inflammation, no edema  NEURO: alert & oriented x 3 with fluent speech, no focal motor/sensory deficits  PERFORMANCE STATUS: ECOG 1  LABORATORY DATA: Lab Results  Component Value Date   WBC  3.3 (L) 07/13/2020   HGB 14.3 07/13/2020   HCT 42.0 07/13/2020   MCV 91.9 07/13/2020   PLT 82 (L) 07/13/2020      Chemistry      Component Value Date/Time   NA 131 (L) 03/24/2020 1627   K 2.4 (LL) 03/24/2020 1627   CL 90 (L) 03/24/2020 1627   CO2 22 03/24/2020 1627   BUN 15 03/24/2020 1627   CREATININE 0.98 03/24/2020 1627      Component Value Date/Time   CALCIUM 9.4 03/24/2020 1627       RADIOGRAPHIC STUDIES: No results found.  ASSESSMENT: This is a very pleasant 50 years old white female presented for evaluation of leukocytopenia and thrombocytopenia of unclear etiology.  This could be secondary to her chronic pancreatitis with nutritional deficiency versus autoimmune disorder.  Underlying bone marrow abnormality could not be also excluded at this point.   PLAN: I had a lengthy discussion with the patient today about her current condition and further investigation to confirm her diagnosis. I order several studies today including repeat CBC that showed improvement of her total white blood count to 3.3 but her platelets count are down to 82,000.  She also has hypokalemia and she was advised to take her potassium supplement. I also order hepatitis panel, HIV, rheumatoid factor, ANA, iron study, ferritin, vitamin B12 level as well as serum folate and serum protein electrophoresis. If the pending lab results did not show any clear etiology of her bicytopenia, I may consider The patient for bone marrow biopsy and aspirate after her upcoming visit in 1 months. I will see her back for follow-up visit in 1 months for evaluation with repeat CBC. She was advised to call if she has any other concerning symptoms in the interval. The patient voices understanding of current disease status and treatment options and is in agreement with the current care plan.  All questions were answered. The patient knows to call the clinic with any problems, questions or concerns. We can certainly see the  patient much sooner if necessary.  Thank you so much for allowing me to participate in the care of Berda Santellan. I will continue to follow up the patient with you and assist in her care.  The total time spent in the appointment was 60 minutes.  Disclaimer: This note was dictated with voice recognition software. Similar sounding words can inadvertently be transcribed and may not be corrected upon review.   Eilleen Kempf July 13, 2020, 12:03 PM

## 2020-07-14 DIAGNOSIS — F332 Major depressive disorder, recurrent severe without psychotic features: Secondary | ICD-10-CM | POA: Diagnosis not present

## 2020-07-14 LAB — RHEUMATOID FACTOR: Rheumatoid fact SerPl-aCnc: <10 IU/mL (ref <14.0)

## 2020-07-15 LAB — PROTEIN ELECTROPHORESIS, SERUM, WITH REFLEX
A/G Ratio: 2 — ABNORMAL HIGH (ref 0.7–1.7)
Albumin ELP: 4.3 g/dL (ref 2.9–4.4)
Alpha-1-Globulin: 0.2 g/dL (ref 0.0–0.4)
Alpha-2-Globulin: 0.6 g/dL (ref 0.4–1.0)
Beta Globulin: 0.9 g/dL (ref 0.7–1.3)
Gamma Globulin: 0.4 g/dL (ref 0.4–1.8)
Globulin, Total: 2.2 g/dL (ref 2.2–3.9)
Total Protein ELP: 6.5 g/dL (ref 6.0–8.5)

## 2020-07-15 LAB — ANTINUCLEAR ANTIBODIES, IFA: ANA Ab, IFA: NEGATIVE

## 2020-07-19 DIAGNOSIS — F332 Major depressive disorder, recurrent severe without psychotic features: Secondary | ICD-10-CM | POA: Diagnosis not present

## 2020-07-21 DIAGNOSIS — F332 Major depressive disorder, recurrent severe without psychotic features: Secondary | ICD-10-CM | POA: Diagnosis not present

## 2020-07-26 DIAGNOSIS — F332 Major depressive disorder, recurrent severe without psychotic features: Secondary | ICD-10-CM | POA: Diagnosis not present

## 2020-07-27 DIAGNOSIS — F4321 Adjustment disorder with depressed mood: Secondary | ICD-10-CM | POA: Diagnosis not present

## 2020-07-27 DIAGNOSIS — F410 Panic disorder [episodic paroxysmal anxiety] without agoraphobia: Secondary | ICD-10-CM | POA: Diagnosis not present

## 2020-07-27 DIAGNOSIS — F3341 Major depressive disorder, recurrent, in partial remission: Secondary | ICD-10-CM | POA: Diagnosis not present

## 2020-07-28 DIAGNOSIS — F332 Major depressive disorder, recurrent severe without psychotic features: Secondary | ICD-10-CM | POA: Diagnosis not present

## 2020-07-29 DIAGNOSIS — R634 Abnormal weight loss: Secondary | ICD-10-CM | POA: Insufficient documentation

## 2020-07-29 DIAGNOSIS — R1110 Vomiting, unspecified: Secondary | ICD-10-CM | POA: Diagnosis not present

## 2020-07-30 DIAGNOSIS — F4323 Adjustment disorder with mixed anxiety and depressed mood: Secondary | ICD-10-CM | POA: Diagnosis not present

## 2020-08-02 DIAGNOSIS — F332 Major depressive disorder, recurrent severe without psychotic features: Secondary | ICD-10-CM | POA: Diagnosis not present

## 2020-08-04 DIAGNOSIS — F332 Major depressive disorder, recurrent severe without psychotic features: Secondary | ICD-10-CM | POA: Diagnosis not present

## 2020-08-09 DIAGNOSIS — F332 Major depressive disorder, recurrent severe without psychotic features: Secondary | ICD-10-CM | POA: Diagnosis not present

## 2020-08-10 DIAGNOSIS — H43813 Vitreous degeneration, bilateral: Secondary | ICD-10-CM | POA: Diagnosis not present

## 2020-08-11 DIAGNOSIS — F332 Major depressive disorder, recurrent severe without psychotic features: Secondary | ICD-10-CM | POA: Diagnosis not present

## 2020-08-12 ENCOUNTER — Encounter: Payer: Self-pay | Admitting: Internal Medicine

## 2020-08-12 ENCOUNTER — Inpatient Hospital Stay: Payer: BC Managed Care – PPO

## 2020-08-12 ENCOUNTER — Inpatient Hospital Stay: Payer: BC Managed Care – PPO | Attending: Internal Medicine | Admitting: Internal Medicine

## 2020-08-12 ENCOUNTER — Other Ambulatory Visit: Payer: Self-pay

## 2020-08-12 ENCOUNTER — Telehealth: Payer: Self-pay | Admitting: Internal Medicine

## 2020-08-12 VITALS — BP 122/80 | HR 88 | Temp 97.9°F | Resp 20 | Ht 61.0 in | Wt 106.0 lb

## 2020-08-12 DIAGNOSIS — D696 Thrombocytopenia, unspecified: Secondary | ICD-10-CM | POA: Diagnosis not present

## 2020-08-12 DIAGNOSIS — D72819 Decreased white blood cell count, unspecified: Secondary | ICD-10-CM | POA: Insufficient documentation

## 2020-08-12 DIAGNOSIS — D709 Neutropenia, unspecified: Secondary | ICD-10-CM

## 2020-08-12 LAB — CBC WITH DIFFERENTIAL (CANCER CENTER ONLY)
Abs Immature Granulocytes: 0 10*3/uL (ref 0.00–0.07)
Basophils Absolute: 0 10*3/uL (ref 0.0–0.1)
Basophils Relative: 0 %
Eosinophils Absolute: 0.1 10*3/uL (ref 0.0–0.5)
Eosinophils Relative: 4 %
HCT: 40.3 % (ref 36.0–46.0)
Hemoglobin: 14 g/dL (ref 12.0–15.0)
Immature Granulocytes: 0 %
Lymphocytes Relative: 24 %
Lymphs Abs: 0.6 10*3/uL — ABNORMAL LOW (ref 0.7–4.0)
MCH: 32.4 pg (ref 26.0–34.0)
MCHC: 34.7 g/dL (ref 30.0–36.0)
MCV: 93.3 fL (ref 80.0–100.0)
Monocytes Absolute: 0.2 10*3/uL (ref 0.1–1.0)
Monocytes Relative: 7 %
Neutro Abs: 1.6 10*3/uL — ABNORMAL LOW (ref 1.7–7.7)
Neutrophils Relative %: 65 %
Platelet Count: 80 10*3/uL — ABNORMAL LOW (ref 150–400)
RBC: 4.32 MIL/uL (ref 3.87–5.11)
RDW: 13.2 % (ref 11.5–15.5)
WBC Count: 2.4 10*3/uL — ABNORMAL LOW (ref 4.0–10.5)
nRBC: 0 % (ref 0.0–0.2)

## 2020-08-12 NOTE — Telephone Encounter (Signed)
Scheduled follow-up appointment per 5/12 los. Patient is aware. 

## 2020-08-12 NOTE — Progress Notes (Signed)
[place] [phone]   Fax:(336) [phone]  OFFICE PROGRESS NOTE  Maurice Small, MD [address] [place] [address]  DIAGNOSIS: leukocytopenia and thrombocytopenia of unclear etiology.  PRIOR THERAPY: None  CURRENT THERAPY: Observation  INTERVAL HISTORY: Maria Burnett 50 y.o. female returns to the clinic today for follow-up visit.  The patient continues to complain of increasing fatigue and weakness as well as persistent nausea.  She was seen by gastroenterology and had extensive work including imaging studies and upper endoscopy that were unremarkable.  She also had extensive blood work up from's regarding her leukocytopenia and thrombocytopenia that were unremarkable.  She is here today for evaluation and repeat blood work.  She denied having any chest pain, shortness of breath, cough or hemoptysis.  She continues to have intermittent nausea but no diarrhea or constipation and no abdominal pain.  She has occasional bruising.  She had repeat CBC performed earlier today and she is here for evaluation and discussion of her lab results.  MEDICAL HISTORY: Past Medical History:  Diagnosis Date  . Anxiety   . Asthma   . Depression   . Migraine   . Perimenopause    "premature ovarian failure"  . PTSD (post-traumatic stress disorder)   . Vertigo     ALLERGIES:  is allergic to other, prednisone, and propranolol hcl.  MEDICATIONS:  Current Outpatient Medications  Medication Sig Dispense Refill  . ondansetron (ZOFRAN-ODT) 8 MG disintegrating tablet Take 1 tablet by mouth 3 (three) times daily as needed.    . Albuterol Sulfate, sensor, (PROAIR DIGIHALER) 108 (90 Base) MCG/ACT AEPB Inhale into the lungs.    . Boric Acid CRYS 600 mg as directed    . Budesonide-Formoterol Fumarate (SYMBICORT IN) Inhale into the lungs.    . cetirizine (ZYRTEC) 10 MG tablet Take by mouth.    . clonazePAM (KLONOPIN) 1 MG tablet Take 1 mg by mouth 3  (three) times daily as needed.    Marland Kitchen CREON 36000-114000 units CPEP capsule Take 36,000 Units by mouth 4 (four) times daily.    . cycloSPORINE (RESTASIS) 0.05 % ophthalmic emulsion 1 drop into affected eye    . Esketamine HCl, 56 MG Dose, (SPRAVATO, 56 MG DOSE,) 28 MG/DEVICE SOPK Place into the nose.    . hydrochlorothiazide (HYDRODIURIL) 12.5 MG tablet Take 12.5 mg by mouth daily.    . Multiple Vitamins-Minerals (MULTIVITAMIN PO) Take by mouth daily.    . promethazine (PHENERGAN) 25 MG tablet Take 25 mg by mouth every 4 (four) hours as needed.    . topiramate (TOPAMAX) 200 MG tablet Take 200 mg by mouth at bedtime.    . vortioxetine HBr (TRINTELLIX) 20 MG TABS tablet      No current facility-administered medications for this visit.    SURGICAL HISTORY:  Past Surgical History:  Procedure Laterality Date  . HIATAL HERNIA REPAIR    . OVARIAN CYST REMOVAL      REVIEW OF SYSTEMS:  A comprehensive review of systems was negative except for: Constitutional: positive for anorexia, fatigue and weight loss Gastrointestinal: positive for nausea   PHYSICAL EXAMINATION: General appearance: alert, cooperative, fatigued and no distress Head: Normocephalic, without obvious abnormality, atraumatic Neck: no adenopathy, no JVD, supple, symmetrical, trachea midline and thyroid not enlarged, symmetric, no tenderness/mass/nodules Lymph nodes: Cervical, supraclavicular, and axillary nodes normal. Resp: clear to auscultation bilaterally Back: symmetric, no curvature. ROM normal. No CVA tenderness. Cardio: regular rate and rhythm, S1, S2 normal,  no murmur, click, rub or gallop GI: soft, non-tender; bowel sounds normal; no masses,  no organomegaly Extremities: extremities normal, atraumatic, no cyanosis or edema  ECOG PERFORMANCE STATUS: 1 - Symptomatic but completely ambulatory  Blood pressure 122/80, pulse 88, temperature 97.9 F (36.6 C), temperature source Tympanic, resp. rate 20, height _0  (1.549 m),  weight 106 lb (48.1 kg), SpO2 99 %.  LABORATORY DATA: Lab Results  Component Value Date   WBC 2.4 (L) 08/12/2020   HGB 14.0 08/12/2020   HCT 40.3 08/12/2020   MCV 93.3 08/12/2020   PLT 80 (L) 08/12/2020      Chemistry      Component Value Date/Time   NA 136 07/13/2020 1119   K 3.1 (L) 07/13/2020 1119   CL 102 07/13/2020 1119   CO2 23 07/13/2020 1119   BUN 15 07/13/2020 1119   CREATININE 0.89 07/13/2020 1119      Component Value Date/Time   CALCIUM 9.4 07/13/2020 1119   ALKPHOS 52 07/13/2020 1119   AST 23 07/13/2020 1119   ALT 22 07/13/2020 1119   BILITOT 0.6 07/13/2020 1119       RADIOGRAPHIC STUDIES: No results found.  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  The patient had extensive blood work that was unremarkable. Repeat CBC today showed further decrease in the total white blood count down to 2.4 and thrombocytopenia of 80,000. I recommended for the patient to proceed with a bone marrow biopsy and aspirate for further evaluation of her bicytopenia. I will see her back for follow-up visit in 3 weeks for evaluation and repeat blood work and discussion of the biopsy results. She was advised to call immediately if she has any other concerning symptoms in the interval. The patient voices understanding of current disease status and treatment options and is in agreement with the current care plan.  All questions were answered. The patient knows to call the clinic with any problems, questions or concerns. We can certainly see the patient much sooner if necessary.  The total time spent in the appointment was 20 minutes.  Disclaimer: This note was dictated with voice recognition software. Similar sounding words can inadvertently be transcribed and may not be corrected upon review.

## 2020-08-16 DIAGNOSIS — F332 Major depressive disorder, recurrent severe without psychotic features: Secondary | ICD-10-CM | POA: Diagnosis not present

## 2020-08-17 DIAGNOSIS — F341 Dysthymic disorder: Secondary | ICD-10-CM | POA: Diagnosis not present

## 2020-08-17 DIAGNOSIS — F410 Panic disorder [episodic paroxysmal anxiety] without agoraphobia: Secondary | ICD-10-CM | POA: Diagnosis not present

## 2020-08-17 DIAGNOSIS — F4322 Adjustment disorder with anxiety: Secondary | ICD-10-CM | POA: Diagnosis not present

## 2020-08-18 DIAGNOSIS — F332 Major depressive disorder, recurrent severe without psychotic features: Secondary | ICD-10-CM | POA: Diagnosis not present

## 2020-08-23 DIAGNOSIS — F332 Major depressive disorder, recurrent severe without psychotic features: Secondary | ICD-10-CM | POA: Diagnosis not present

## 2020-08-23 DIAGNOSIS — R5382 Chronic fatigue, unspecified: Secondary | ICD-10-CM | POA: Diagnosis not present

## 2020-08-24 ENCOUNTER — Ambulatory Visit (HOSPITAL_COMMUNITY): Payer: BC Managed Care – PPO

## 2020-08-25 DIAGNOSIS — F332 Major depressive disorder, recurrent severe without psychotic features: Secondary | ICD-10-CM | POA: Diagnosis not present

## 2020-08-26 ENCOUNTER — Ambulatory Visit (HOSPITAL_COMMUNITY): Payer: BC Managed Care – PPO

## 2020-08-26 ENCOUNTER — Other Ambulatory Visit: Payer: Self-pay | Admitting: Student

## 2020-08-26 DIAGNOSIS — D72819 Decreased white blood cell count, unspecified: Secondary | ICD-10-CM | POA: Diagnosis not present

## 2020-08-26 DIAGNOSIS — D696 Thrombocytopenia, unspecified: Secondary | ICD-10-CM | POA: Diagnosis not present

## 2020-08-26 DIAGNOSIS — R110 Nausea: Secondary | ICD-10-CM | POA: Diagnosis not present

## 2020-08-27 ENCOUNTER — Ambulatory Visit (HOSPITAL_COMMUNITY)
Admission: RE | Admit: 2020-08-27 | Discharge: 2020-08-27 | Disposition: A | Discharge status: Home / Self Care | Payer: BC Managed Care – PPO | Source: Ambulatory Visit | Attending: Internal Medicine | Admitting: Internal Medicine

## 2020-08-27 ENCOUNTER — Other Ambulatory Visit: Payer: Self-pay

## 2020-08-27 ENCOUNTER — Encounter (HOSPITAL_COMMUNITY): Payer: Self-pay

## 2020-08-27 DIAGNOSIS — D7589 Other specified diseases of blood and blood-forming organs: Secondary | ICD-10-CM | POA: Diagnosis not present

## 2020-08-27 DIAGNOSIS — D696 Thrombocytopenia, unspecified: Secondary | ICD-10-CM | POA: Insufficient documentation

## 2020-08-27 DIAGNOSIS — D709 Neutropenia, unspecified: Secondary | ICD-10-CM

## 2020-08-27 DIAGNOSIS — Z79899 Other long term (current) drug therapy: Secondary | ICD-10-CM | POA: Diagnosis not present

## 2020-08-27 DIAGNOSIS — Z7951 Long term (current) use of inhaled steroids: Secondary | ICD-10-CM | POA: Diagnosis not present

## 2020-08-27 DIAGNOSIS — Z87891 Personal history of nicotine dependence: Secondary | ICD-10-CM | POA: Insufficient documentation

## 2020-08-27 DIAGNOSIS — D72819 Decreased white blood cell count, unspecified: Secondary | ICD-10-CM | POA: Insufficient documentation

## 2020-08-27 DIAGNOSIS — F4321 Adjustment disorder with depressed mood: Secondary | ICD-10-CM | POA: Diagnosis not present

## 2020-08-27 DIAGNOSIS — Z0389 Encounter for observation for other suspected diseases and conditions ruled out: Secondary | ICD-10-CM | POA: Diagnosis not present

## 2020-08-27 DIAGNOSIS — F3341 Major depressive disorder, recurrent, in partial remission: Secondary | ICD-10-CM | POA: Diagnosis not present

## 2020-08-27 LAB — CBC WITH DIFFERENTIAL/PLATELET
Abs Immature Granulocytes: 0.01 10*3/uL (ref 0.00–0.07)
Basophils Absolute: 0 10*3/uL (ref 0.0–0.1)
Basophils Relative: 0 %
Eosinophils Absolute: 0.1 10*3/uL (ref 0.0–0.5)
Eosinophils Relative: 3 %
HCT: 41.3 % (ref 36.0–46.0)
Hemoglobin: 14.1 g/dL (ref 12.0–15.0)
Immature Granulocytes: 0 %
Lymphocytes Relative: 33 %
Lymphs Abs: 0.9 10*3/uL (ref 0.7–4.0)
MCH: 32.4 pg (ref 26.0–34.0)
MCHC: 34.1 g/dL (ref 30.0–36.0)
MCV: 94.9 fL (ref 80.0–100.0)
Monocytes Absolute: 0.2 10*3/uL (ref 0.1–1.0)
Monocytes Relative: 8 %
Neutro Abs: 1.5 10*3/uL — ABNORMAL LOW (ref 1.7–7.7)
Neutrophils Relative %: 56 %
Platelets: 88 10*3/uL — ABNORMAL LOW (ref 150–400)
RBC: 4.35 MIL/uL (ref 3.87–5.11)
RDW: 13.2 % (ref 11.5–15.5)
WBC: 2.7 10*3/uL — ABNORMAL LOW (ref 4.0–10.5)
nRBC: 0 % (ref 0.0–0.2)

## 2020-08-27 MED ORDER — FENTANYL CITRATE (PF) 100 MCG/2ML IJ SOLN
INTRAMUSCULAR | Status: AC
  Filled 2020-08-27: qty 2

## 2020-08-27 MED ORDER — MIDAZOLAM HCL 2 MG/2ML IJ SOLN
INTRAMUSCULAR | Status: AC | PRN
  Administered 2020-08-27: 1 mg via INTRAVENOUS
  Administered 2020-08-27: 2 mg via INTRAVENOUS
  Administered 2020-08-27: 1 mg via INTRAVENOUS

## 2020-08-27 MED ORDER — SODIUM CHLORIDE 0.9 % IV SOLN: INTRAVENOUS | Status: DC

## 2020-08-27 MED ORDER — LIDOCAINE HCL (PF) 1 % IJ SOLN
INTRAMUSCULAR | Status: AC | PRN
  Administered 2020-08-27: 10 mL

## 2020-08-27 MED ORDER — FENTANYL CITRATE (PF) 100 MCG/2ML IJ SOLN
INTRAMUSCULAR | Status: AC | PRN
  Administered 2020-08-27 (×2): 50 ug via INTRAVENOUS

## 2020-08-27 MED ORDER — MIDAZOLAM HCL 2 MG/2ML IJ SOLN
INTRAMUSCULAR | Status: AC
  Filled 2020-08-27: qty 4

## 2020-08-27 NOTE — Procedures (Signed)
Interventional Radiology Procedure Note  Procedure: CT guided aspirate and core biopsy of right posterior iliac bone Complications: None Recommendations: - Bedrest supine x 1 hrs - OTC's PRN  Pain - Follow biopsy results  Signed,  Hildegard Hlavac S. Jann Milkovich, DO    

## 2020-08-27 NOTE — Discharge Instructions (Signed)
Please call Interventional Radiology clinic [phone] with any questions or concerns.  You may remove your dressing and shower tomorrow.   Bone Marrow Aspiration and Bone Marrow Biopsy, Adult, Care After This sheet gives you information about how to care for yourself after your procedure. Your health care provider may also give you more specific instructions. If you have problems or questions, contact your health care provider. What can I expect after the procedure? After the procedure, it is common to have:  Mild pain and tenderness.  Swelling.  Bruising. Follow these instructions at home: Puncture site care 1. Follow instructions from your health care provider about how to take care of the puncture site. Make sure you: ? Wash your hands with soap and water before and after you change your bandage (dressing). If soap and water are not available, use hand sanitizer. ? Change your dressing as told by your health care provider. 2. Check your puncture site every day for signs of infection. Check for: ? More redness, swelling, or pain. ? Fluid or blood. ? Warmth. ? Pus or a bad smell.   Activity 1. Return to your normal activities as told by your health care provider. Ask your health care provider what activities are safe for you. 2. Do not lift anything that is heavier than 10 lb (4.5 kg), or the limit that you are told, until your health care provider says that it is safe. 3. Do not drive for 24 hours if you were given a sedative during your procedure. General instructions 1. Take over-the-counter and prescription medicines only as told by your health care provider. 2. Do not take baths, swim, or use a hot tub until your health care provider approves. Ask your health care provider if you may take showers. You may only be allowed to take sponge baths. 3. If directed, put ice on the affected area. To do this: ? Put ice in a plastic bag. ? Place a towel between your skin and the  bag. ? Leave the ice on for 20 minutes, 2-3 times a day. 4. Keep all follow-up visits as told by your health care provider. This is important.   Contact a health care provider if:  Your pain is not controlled with medicine.  You have a fever.  You have more redness, swelling, or pain around the puncture site.  You have fluid or blood coming from the puncture site.  Your puncture site feels warm to the touch.  You have pus or a bad smell coming from the puncture site. Summary  After the procedure, it is common to have mild pain, tenderness, swelling, and bruising.  Follow instructions from your health care provider about how to take care of the puncture site and what activities are safe for you.  Take over-the-counter and prescription medicines only as told by your health care provider.  Contact a health care provider if you have any signs of infection, such as fluid or blood coming from the puncture site. This information is not intended to replace advice given to you by your health care provider. Make sure you discuss any questions you have with your health care provider. Document Revised: 08/06/2018 Document Reviewed: 08/06/2018 Elsevier Patient Education  2021 [place].   Moderate Conscious Sedation, Adult, Care After This sheet gives you information about how to care for yourself after your procedure. Your health care provider may also give you more specific instructions. If you have problems or questions, contact your health care provider. What  can I expect after the procedure? After the procedure, it is common to have:  Sleepiness for several hours.  Impaired judgment for several hours.  Difficulty with balance.  Vomiting if you eat too soon. Follow these instructions at home: For the time period you were told by your health care provider: 3. Rest. 4. Do not participate in activities where you could fall or become injured. 5. Do not drive or use  machinery. 6. Do not drink alcohol. 7. Do not take sleeping pills or medicines that cause drowsiness. 8. Do not make important decisions or sign legal documents. 9. Do not take care of children on your own.      Eating and drinking 4. Follow the diet recommended by your health care provider. 5. Drink enough fluid to keep your urine pale yellow. 6. If you vomit: ? Drink water, juice, or soup when you can drink without vomiting. ? Make sure you have little or no nausea before eating solid foods.   General instructions 5. Take over-the-counter and prescription medicines only as told by your health care provider. 6. Have a responsible adult stay with you for the time you are told. It is important to have someone help care for you until you are awake and alert. 7. Do not smoke. 8. Keep all follow-up visits as told by your health care provider. This is important. Contact a health care provider if:  You are still sleepy or having trouble with balance after 24 hours.  You feel light-headed.  You keep feeling nauseous or you keep vomiting.  You develop a rash.  You have a fever.  You have redness or swelling around the IV site. Get help right away if:  You have trouble breathing.  You have new-onset confusion at home. Summary  After the procedure, it is common to feel sleepy, have impaired judgment, or feel nauseous if you eat too soon.  Rest after you get home. Know the things you should not do after the procedure.  Follow the diet recommended by your health care provider and drink enough fluid to keep your urine pale yellow.  Get help right away if you have trouble breathing or new-onset confusion at home. This information is not intended to replace advice given to you by your health care provider. Make sure you discuss any questions you have with your health care provider. Document Revised: 07/18/2019 Document Reviewed: 02/13/2019 Elsevier Patient Education  2021 Elsevier  Inc.      

## 2020-08-27 NOTE — Consult Note (Signed)
Chief Complaint: Patient was seen in consultation today for CT-guided bone marrow biopsy  Referring Physician(s): Mohamed,Mohamed  Supervising Physician: Corrie Mckusick  Patient Status: [facility] - Out-pt  History of Present Illness: Maria Burnett is a 50 y.o. female, ex smoker,  with past medical history of anxiety/depression, asthma, migraine headaches, PTSD.  She now presents with leukocytopenia and thrombocytopenia of uncertain etiology and is scheduled today for CT-guided bone marrow biopsy for further evaluation.  Past Medical History:  Diagnosis Date  . Anxiety   . Asthma   . Depression   . Migraine   . Perimenopause    "premature ovarian failure"  . PTSD (post-traumatic stress disorder)   . Vertigo     Past Surgical History:  Procedure Laterality Date  . HIATAL HERNIA REPAIR    . OVARIAN CYST REMOVAL      Allergies: Other, Prednisone, and Propranolol hcl  Medications: Prior to Admission medications   Medication Sig Start Date End Date Taking? Authorizing Provider  Albuterol Sulfate, sensor, (PROAIR DIGIHALER) 108 (90 Base) MCG/ACT AEPB Inhale into the lungs.   Yes [provider]  Boric Acid CRYS 600 mg as directed 07/23/18  Yes [provider]  Budesonide-Formoterol Fumarate (SYMBICORT IN) Inhale into the lungs.   Yes [provider]  cetirizine (ZYRTEC) 10 MG tablet Take by mouth.   Yes [provider]  clonazePAM (KLONOPIN) 1 MG tablet Take 1 mg by mouth 3 (three) times daily as needed. 07/27/20  Yes [provider]  CREON 36000-114000 units CPEP capsule Take 36,000 Units by mouth 4 (four) times daily. 06/08/20  Yes [provider]  cycloSPORINE (RESTASIS) 0.05 % ophthalmic emulsion 1 drop into affected eye 08/22/16  Yes [provider]  Esketamine HCl, 56 MG Dose, (SPRAVATO, 56 MG DOSE,) 28 MG/DEVICE SOPK Place into the nose.   Yes [provider]  hydrochlorothiazide (HYDRODIURIL) 12.5 MG  tablet Take 12.5 mg by mouth daily. 04/29/20  Yes [provider]  Multiple Vitamins-Minerals (MULTIVITAMIN PO) Take by mouth daily.   Yes [provider]  ondansetron (ZOFRAN-ODT) 8 MG disintegrating tablet Take 1 tablet by mouth 3 (three) times daily as needed. 08/12/20  Yes [provider]  promethazine (PHENERGAN) 25 MG tablet Take 25 mg by mouth every 4 (four) hours as needed. 06/16/16  Yes [provider]  topiramate (TOPAMAX) 200 MG tablet Take 200 mg by mouth at bedtime. 06/18/20  Yes [provider]  vortioxetine HBr (TRINTELLIX) 20 MG TABS tablet    Yes [provider]     Family History  Problem Relation Age of Onset  . Hypertension Mother   . Lung disease Father   . Alcoholism Father   . Hyperlipidemia Father   . Breast cancer Sister     Social History   Socioeconomic History  . Marital status: Single    Spouse name: Not on file  . Number of children: 0  . Years of education: Bachelors  . Highest education level: Not on file  Occupational History  . Occupation: Self-employed - rental/sales  Tobacco Use  . Smoking status: Former Research scientist (life sciences)  . Smokeless tobacco: Never Used  . Tobacco comment: Not smoked since her 28s - only 2 cigarettes per day for about one year.  Substance and Sexual Activity  . Alcohol use: Yes    Comment: Social - 1-2 glasses of wine   . Drug use: No  . Sexual activity: Not on file  Other Topics Concern  . Not on  file  Social History Narrative   Lives at home alone.   Right-handed.   Rare caffeine use.   Social Determinants of Health   Financial Resource Strain: Not on file  Food Insecurity: Not on file  Transportation Needs: Not on file  Physical Activity: Not on file  Stress: Not on file  Social Connections: Not on file     Review of Systems denies fever, headache, chest pain, dyspnea, cough, abdominal/back pain, vomiting or bleeding.  She does have persistent nausea, fatigue, weight  loss and easy bruising  Vital Signs: BP 108/83   Pulse 84   Temp 98.7 F (37.1 C) (Oral)   Resp 18   SpO2 100%   Physical Exam awake, alert.  Chest clear to auscultation bilaterally.  Heart with regular rate and rhythm.  Abdomen soft, positive bowel sounds, nontender.  No lower extremity edema.  Imaging: No results found.  Labs:  CBC: Recent Labs    03/24/20 1627 07/13/20 1119 08/12/20 1004  WBC 4.4 3.3* 2.4*  HGB 14.6 14.3 14.0  HCT 39.1 42.0 40.3  PLT 122* 82* 80*    COAGS: No results for input(s): INR, APTT in the last 8760 hours.  BMP: Recent Labs    03/24/20 1627 07/13/20 1119  NA 131* 136  K 2.4* 3.1*  CL 90* 102  CO2 22 23  GLUCOSE 127* 137*  BUN 15 15  CALCIUM 9.4 9.4  CREATININE 0.98 0.89  GFRNONAA >60 >60    LIVER FUNCTION TESTS: Recent Labs    07/13/20 1119  BILITOT 0.6  AST 23  ALT 22  ALKPHOS 52  PROT 7.1  ALBUMIN 4.6    TUMOR MARKERS: No results for input(s): AFPTM, CEA, CA199, CHROMGRNA in the last 8760 hours.  Assessment and Plan: 50 y.o. female, ex smoker,  with past medical history of anxiety/depression, asthma, migraine headaches, PTSD.  She now presents with leukocytopenia and thrombocytopenia of uncertain etiology and is scheduled today for CT-guided bone marrow biopsy for further evaluation.Risks and benefits of procedure was discussed with the patient  including, but not limited to bleeding, infection, damage to adjacent structures or low yield requiring additional tests.  All of the questions were answered and there is agreement to proceed.  Consent signed and in chart.     Thank you for this interesting consult.  I greatly enjoyed meeting Maria Burnett and look forward to participating in their care.  A copy of this report was sent to the requesting provider on this date.  Electronically Signed: D. Rowe Robert, PA-C 08/27/2020, 9:50 AM   I spent a total of  20 minutes   in face to face in clinical consultation,  greater than 50% of which was counseling/coordinating care for CT-guided bone marrow biopsy

## 2020-08-31 ENCOUNTER — Other Ambulatory Visit: Payer: Self-pay | Admitting: *Deleted

## 2020-08-31 ENCOUNTER — Other Ambulatory Visit: Payer: Self-pay | Admitting: Medical Oncology

## 2020-08-31 ENCOUNTER — Inpatient Hospital Stay: Payer: BC Managed Care – PPO | Admitting: Internal Medicine

## 2020-08-31 ENCOUNTER — Inpatient Hospital Stay: Payer: BC Managed Care – PPO

## 2020-08-31 ENCOUNTER — Other Ambulatory Visit: Payer: Self-pay

## 2020-08-31 ENCOUNTER — Encounter: Payer: Self-pay | Admitting: Internal Medicine

## 2020-08-31 VITALS — BP 108/76 | HR 86 | Temp 98.2°F | Resp 18 | Ht 61.0 in | Wt 105.2 lb

## 2020-08-31 DIAGNOSIS — D709 Neutropenia, unspecified: Secondary | ICD-10-CM

## 2020-08-31 DIAGNOSIS — D696 Thrombocytopenia, unspecified: Secondary | ICD-10-CM | POA: Diagnosis not present

## 2020-08-31 DIAGNOSIS — D708 Other neutropenia: Secondary | ICD-10-CM

## 2020-08-31 DIAGNOSIS — D72819 Decreased white blood cell count, unspecified: Secondary | ICD-10-CM | POA: Diagnosis not present

## 2020-08-31 LAB — CBC WITH DIFFERENTIAL (CANCER CENTER ONLY)
Abs Immature Granulocytes: 0.01 10*3/uL (ref 0.00–0.07)
Basophils Absolute: 0 10*3/uL (ref 0.0–0.1)
Basophils Relative: 1 %
Eosinophils Absolute: 0.1 10*3/uL (ref 0.0–0.5)
Eosinophils Relative: 3 %
HCT: 38 % (ref 36.0–46.0)
Hemoglobin: 13.2 g/dL (ref 12.0–15.0)
Immature Granulocytes: 0 %
Lymphocytes Relative: 34 %
Lymphs Abs: 0.8 10*3/uL (ref 0.7–4.0)
MCH: 32.3 pg (ref 26.0–34.0)
MCHC: 34.7 g/dL (ref 30.0–36.0)
MCV: 92.9 fL (ref 80.0–100.0)
Monocytes Absolute: 0.2 10*3/uL (ref 0.1–1.0)
Monocytes Relative: 8 %
Neutro Abs: 1.2 10*3/uL — ABNORMAL LOW (ref 1.7–7.7)
Neutrophils Relative %: 54 %
Platelet Count: 81 10*3/uL — ABNORMAL LOW (ref 150–400)
RBC: 4.09 MIL/uL (ref 3.87–5.11)
RDW: 13.2 % (ref 11.5–15.5)
WBC Count: 2.3 10*3/uL — ABNORMAL LOW (ref 4.0–10.5)
nRBC: 0 % (ref 0.0–0.2)

## 2020-08-31 LAB — CMP (CANCER CENTER ONLY)
ALT: 27 U/L (ref 0–44)
AST: 29 U/L (ref 15–41)
Albumin: 4.2 g/dL (ref 3.5–5.0)
Alkaline Phosphatase: 50 U/L (ref 38–126)
Anion gap: 11 (ref 5–15)
BUN: 14 mg/dL (ref 6–20)
CO2: 23 mmol/L (ref 22–32)
Calcium: 9.5 mg/dL (ref 8.9–10.3)
Chloride: 105 mmol/L (ref 98–111)
Creatinine: 0.82 mg/dL (ref 0.44–1.00)
GFR, Estimated: >60 mL/min (ref >60)
Glucose, Bld: 132 mg/dL — ABNORMAL HIGH (ref 70–99)
Potassium: 3.9 mmol/L (ref 3.5–5.1)
Sodium: 139 mmol/L (ref 135–145)
Total Bilirubin: 0.7 mg/dL (ref 0.3–1.2)
Total Protein: 6.6 g/dL (ref 6.5–8.1)

## 2020-08-31 LAB — SURGICAL PATHOLOGY

## 2020-08-31 NOTE — Progress Notes (Signed)
[place] [phone]   [phone]  OFFICE PROGRESS NOTE  Maurice Small, MD [address] [place] [address]  DIAGNOSIS: leukocytopenia and thrombocytopenia of unclear etiology.  PRIOR THERAPY: None  CURRENT THERAPY: Observation  INTERVAL HISTORY: Maria Burnett 50 y.o. female returns to the clinic today for follow-up visit.  The patient complains of constant nausea now and she is currently on Zofran.  She was seen by her gastroenterologist and expected to have additional test for her persistent nausea.  She denied having any current chest pain, shortness of breath, cough or hemoptysis.  She has no fever or chills.  She has no significant weight loss or night sweats.  She had a bone marrow biopsy and aspirate on 08/27/2020 and she is here for evaluation and discussion of the biopsy results and recommendation regarding her condition.  MEDICAL HISTORY: Past Medical History:  Diagnosis Date  . Anxiety   . Asthma   . Depression   . Migraine   . Perimenopause    "premature ovarian failure"  . PTSD (post-traumatic stress disorder)   . Vertigo     ALLERGIES:  is allergic to other, prednisone, and propranolol hcl.  MEDICATIONS:  Current Outpatient Medications  Medication Sig Dispense Refill  . Albuterol Sulfate, sensor, (PROAIR DIGIHALER) 108 (90 Base) MCG/ACT AEPB Inhale into the lungs.    . Boric Acid CRYS 600 mg as directed    . Budesonide-Formoterol Fumarate (SYMBICORT IN) Inhale into the lungs.    . cetirizine (ZYRTEC) 10 MG tablet Take by mouth.    . clonazePAM (KLONOPIN) 1 MG tablet Take 1 mg by mouth 3 (three) times daily as needed.    Marland Kitchen CREON 36000-114000 units CPEP capsule Take 36,000 Units by mouth 4 (four) times daily.    . cycloSPORINE (RESTASIS) 0.05 % ophthalmic emulsion 1 drop into affected eye    . Esketamine HCl, 56 MG Dose, (SPRAVATO, 56 MG DOSE,) 28 MG/DEVICE SOPK Place into the nose.    .  hydrochlorothiazide (HYDRODIURIL) 12.5 MG tablet Take 12.5 mg by mouth daily.    . Multiple Vitamins-Minerals (MULTIVITAMIN PO) Take by mouth daily.    . ondansetron (ZOFRAN-ODT) 8 MG disintegrating tablet Take 1 tablet by mouth 3 (three) times daily as needed.    . promethazine (PHENERGAN) 25 MG tablet Take 25 mg by mouth every 4 (four) hours as needed.    . topiramate (TOPAMAX) 200 MG tablet Take 200 mg by mouth at bedtime.    . vortioxetine HBr (TRINTELLIX) 20 MG TABS tablet      No current facility-administered medications for this visit.    SURGICAL HISTORY:  Past Surgical History:  Procedure Laterality Date  . HIATAL HERNIA REPAIR    . OVARIAN CYST REMOVAL      REVIEW OF SYSTEMS:  A comprehensive review of systems was negative except for: Constitutional: positive for anorexia and fatigue Gastrointestinal: positive for nausea   PHYSICAL EXAMINATION: General appearance: alert, cooperative, fatigued and no distress Head: Normocephalic, without obvious abnormality, atraumatic Neck: no adenopathy, no JVD, supple, symmetrical, trachea midline and thyroid not enlarged, symmetric, no tenderness/mass/nodules Lymph nodes: Cervical, supraclavicular, and axillary nodes normal. Resp: clear to auscultation bilaterally Back: symmetric, no curvature. ROM normal. No CVA tenderness. Cardio: regular rate and rhythm, S1, S2 normal, no murmur, click, rub or gallop GI: soft, non-tender; bowel sounds normal; no masses,  no organomegaly Extremities: extremities normal, atraumatic, no cyanosis or edema  ECOG PERFORMANCE STATUS:  1 - Symptomatic but completely ambulatory  Blood pressure 108/76, pulse 86, temperature 98.2 F (36.8 C), temperature source Tympanic, resp. rate 18, height 5' 1"  (1.549 m), weight 105 lb 3.2 oz (47.7 kg), SpO2 100 %.  LABORATORY DATA: Lab Results  Component Value Date   WBC 2.3 (L) 08/31/2020   HGB 13.2 08/31/2020   HCT 38.0 08/31/2020   MCV 92.9 08/31/2020   PLT 81  (L) 08/31/2020      Chemistry      Component Value Date/Time   NA 136 07/13/2020 1119   K 3.1 (L) 07/13/2020 1119   CL 102 07/13/2020 1119   CO2 23 07/13/2020 1119   BUN 15 07/13/2020 1119   CREATININE 0.89 07/13/2020 1119      Component Value Date/Time   CALCIUM 9.4 07/13/2020 1119   ALKPHOS 52 07/13/2020 1119   AST 23 07/13/2020 1119   ALT 22 07/13/2020 1119   BILITOT 0.6 07/13/2020 1119       RADIOGRAPHIC STUDIES: CT Biopsy  Result Date: Sep 22, 2020 INDICATION: 50 year old female referred for bone marrow biopsy EXAM: CT BONE MARROW BIOPSY AND ASPIRATION; CT BIOPSY MEDICATIONS: None. ANESTHESIA/SEDATION: Moderate (conscious) sedation was employed during this procedure. A total of Versed 4.0 mg and Fentanyl 100 mcg was administered intravenously. Moderate Sedation Time: 10 minutes. The patient's level of consciousness and vital signs were monitored continuously by radiology nursing throughout the procedure under my direct supervision. FLUOROSCOPY TIME:  CT COMPLICATIONS: None PROCEDURE: The procedure risks, benefits, and alternatives were explained to the patient. Questions regarding the procedure were encouraged and answered. The patient understands and consents to the procedure. Scout CT of the pelvis was performed for surgical planning purposes. The posterior pelvis was prepped with Chlorhexidine in a sterile fashion, and a sterile drape was applied covering the operative field. A sterile gown and sterile gloves were used for the procedure. Local anesthesia was provided with 1% Lidocaine. Posterior right iliac bone was targeted for biopsy. The skin and subcutaneous tissues were infiltrated with 1% lidocaine without epinephrine. A small stab incision was made with an 11 blade scalpel, and an 11 gauge Murphy needle was advanced with CT guidance to the posterior cortex. Manual forced was used to advance the needle through the posterior cortex and the stylet was removed. A bone marrow  aspirate was retrieved and passed to a cytotechnologist in the room. The Murphy needle was then advanced without the stylet for a core biopsy. The core biopsy was retrieved and also passed to a cytotechnologist. Manual pressure was used for hemostasis and a sterile dressing was placed. No complications were encountered no significant blood loss was encountered. Patient tolerated the procedure well and remained hemodynamically stable throughout. IMPRESSION: Status post CT-guided bone marrow biopsy, with tissue specimen sent to pathology for complete histopathologic analysis Signed, Dulcy Fanny. Earleen Newport, DO [facility] Radiology Electronically Signed   By: Corrie Mckusick D.O.   On: Sep 22, 2020 12:25   CT BONE MARROW BIOPSY & ASPIRATION  Result Date: 2020-09-22 INDICATION: 49 year old female referred for bone marrow biopsy EXAM: CT BONE MARROW BIOPSY AND ASPIRATION; CT BIOPSY MEDICATIONS: None. ANESTHESIA/SEDATION: Moderate (conscious) sedation was employed during this procedure. A total of Versed 4.0 mg and Fentanyl 100 mcg was administered intravenously. Moderate Sedation Time: 10 minutes. The patient's level of consciousness and vital signs were monitored continuously by radiology nursing throughout the procedure under my direct supervision. FLUOROSCOPY TIME:  CT COMPLICATIONS: None PROCEDURE: The procedure risks, benefits, and alternatives were explained  to the patient. Questions regarding the procedure were encouraged and answered. The patient understands and consents to the procedure. Scout CT of the pelvis was performed for surgical planning purposes. The posterior pelvis was prepped with Chlorhexidine in a sterile fashion, and a sterile drape was applied covering the operative field. A sterile gown and sterile gloves were used for the procedure. Local anesthesia was provided with 1% Lidocaine. Posterior right iliac bone was targeted for biopsy. The skin and  subcutaneous tissues were infiltrated with 1% lidocaine without epinephrine. A small stab incision was made with an 11 blade scalpel, and an 11 gauge Murphy needle was advanced with CT guidance to the posterior cortex. Manual forced was used to advance the needle through the posterior cortex and the stylet was removed. A bone marrow aspirate was retrieved and passed to a cytotechnologist in the room. The Murphy needle was then advanced without the stylet for a core biopsy. The core biopsy was retrieved and also passed to a cytotechnologist. Manual pressure was used for hemostasis and a sterile dressing was placed. No complications were encountered no significant blood loss was encountered. Patient tolerated the procedure well and remained hemodynamically stable throughout. IMPRESSION: Status post CT-guided bone marrow biopsy, with tissue specimen sent to pathology for complete histopathologic analysis Signed, Dulcy Fanny. Earleen Newport, DO [facility] Radiology Electronically Signed   By: Corrie Mckusick D.O.   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  The patient had extensive blood work that was unremarkable.  She also had a bone marrow biopsy and aspirate performed last week.  The final report is still pending but discussion with the pathologist Dr. Davina Poke, she indicated that no clear or suspicious abnormalities so far but she will have to run additional testing before releasing the final report. I discussed the results with the patient.  Her leukocytopenia and thrombocytopenia could be immune mediated but definitely the etiology is unclear at this point.  I offered her treatment with a tapered dose of prednisone as a test and treatment for her condition but she is worried about worsening nausea with this treatment. I recommended for her to  continue on observation with repeat blood work by her primary care physician and if she has significant further decline in her [place] less than 500 or platelets count less than 30,000 or any complaints of bleeding, bruises or ecchymosis, I will be happy to see the patient sooner for further evaluation and considering her for treatment with steroids. I will see her on as-needed basis at this point. The patient was advised to call immediately if she has any other concerning symptoms. The patient voices understanding of current disease status and treatment options and is in agreement with the current care plan.  All questions were answered. The patient knows to call the clinic with any problems, questions or concerns. We can certainly see the patient much sooner if necessary.  Disclaimer: This note was dictated with voice recognition software. Similar sounding words can inadvertently be transcribed and may not be corrected upon review.

## 2020-09-01 DIAGNOSIS — F332 Major depressive disorder, recurrent severe without psychotic features: Secondary | ICD-10-CM | POA: Diagnosis not present

## 2020-09-03 DIAGNOSIS — F332 Major depressive disorder, recurrent severe without psychotic features: Secondary | ICD-10-CM | POA: Diagnosis not present

## 2020-09-06 DIAGNOSIS — F332 Major depressive disorder, recurrent severe without psychotic features: Secondary | ICD-10-CM | POA: Diagnosis not present

## 2020-09-07 ENCOUNTER — Other Ambulatory Visit: Payer: Self-pay | Admitting: Family Medicine

## 2020-09-07 DIAGNOSIS — Z1231 Encounter for screening mammogram for malignant neoplasm of breast: Secondary | ICD-10-CM

## 2020-09-08 ENCOUNTER — Encounter (HOSPITAL_COMMUNITY): Payer: Self-pay

## 2020-09-08 DIAGNOSIS — F332 Major depressive disorder, recurrent severe without psychotic features: Secondary | ICD-10-CM | POA: Diagnosis not present

## 2020-09-13 DIAGNOSIS — F332 Major depressive disorder, recurrent severe without psychotic features: Secondary | ICD-10-CM | POA: Diagnosis not present

## 2020-09-15 DIAGNOSIS — R509 Fever, unspecified: Secondary | ICD-10-CM | POA: Diagnosis not present

## 2020-09-16 DIAGNOSIS — R110 Nausea: Secondary | ICD-10-CM | POA: Diagnosis not present

## 2020-09-16 DIAGNOSIS — R509 Fever, unspecified: Secondary | ICD-10-CM | POA: Diagnosis not present

## 2020-09-16 DIAGNOSIS — Z03818 Encounter for observation for suspected exposure to other biological agents ruled out: Secondary | ICD-10-CM | POA: Diagnosis not present

## 2020-09-16 DIAGNOSIS — J029 Acute pharyngitis, unspecified: Secondary | ICD-10-CM | POA: Diagnosis not present

## 2020-09-16 DIAGNOSIS — D72819 Decreased white blood cell count, unspecified: Secondary | ICD-10-CM | POA: Diagnosis not present

## 2020-09-16 DIAGNOSIS — R197 Diarrhea, unspecified: Secondary | ICD-10-CM | POA: Diagnosis not present

## 2020-09-16 DIAGNOSIS — K8681 Exocrine pancreatic insufficiency: Secondary | ICD-10-CM | POA: Diagnosis not present

## 2020-09-22 DIAGNOSIS — F332 Major depressive disorder, recurrent severe without psychotic features: Secondary | ICD-10-CM | POA: Diagnosis not present

## 2020-09-22 DIAGNOSIS — R110 Nausea: Secondary | ICD-10-CM | POA: Diagnosis not present

## 2020-09-22 DIAGNOSIS — R634 Abnormal weight loss: Secondary | ICD-10-CM | POA: Diagnosis not present

## 2020-09-22 DIAGNOSIS — D72819 Decreased white blood cell count, unspecified: Secondary | ICD-10-CM | POA: Diagnosis not present

## 2020-09-24 DIAGNOSIS — Z5181 Encounter for therapeutic drug level monitoring: Secondary | ICD-10-CM | POA: Diagnosis not present

## 2020-09-24 DIAGNOSIS — Z8639 Personal history of other endocrine, nutritional and metabolic disease: Secondary | ICD-10-CM | POA: Diagnosis not present

## 2020-09-27 DIAGNOSIS — F332 Major depressive disorder, recurrent severe without psychotic features: Secondary | ICD-10-CM | POA: Diagnosis not present

## 2020-09-28 DIAGNOSIS — F411 Generalized anxiety disorder: Secondary | ICD-10-CM | POA: Diagnosis not present

## 2020-09-28 DIAGNOSIS — F331 Major depressive disorder, recurrent, moderate: Secondary | ICD-10-CM | POA: Diagnosis not present

## 2020-09-29 DIAGNOSIS — F332 Major depressive disorder, recurrent severe without psychotic features: Secondary | ICD-10-CM | POA: Diagnosis not present

## 2020-10-06 DIAGNOSIS — F332 Major depressive disorder, recurrent severe without psychotic features: Secondary | ICD-10-CM | POA: Diagnosis not present

## 2020-10-08 DIAGNOSIS — F332 Major depressive disorder, recurrent severe without psychotic features: Secondary | ICD-10-CM | POA: Diagnosis not present

## 2020-10-11 DIAGNOSIS — F332 Major depressive disorder, recurrent severe without psychotic features: Secondary | ICD-10-CM | POA: Diagnosis not present

## 2020-10-13 DIAGNOSIS — F419 Anxiety disorder, unspecified: Secondary | ICD-10-CM | POA: Diagnosis not present

## 2020-10-13 DIAGNOSIS — R634 Abnormal weight loss: Secondary | ICD-10-CM | POA: Diagnosis not present

## 2020-10-13 DIAGNOSIS — F332 Major depressive disorder, recurrent severe without psychotic features: Secondary | ICD-10-CM | POA: Diagnosis not present

## 2020-10-13 DIAGNOSIS — R110 Nausea: Secondary | ICD-10-CM | POA: Diagnosis not present

## 2020-10-13 DIAGNOSIS — F32A Depression, unspecified: Secondary | ICD-10-CM | POA: Diagnosis not present

## 2020-10-15 DIAGNOSIS — F418 Other specified anxiety disorders: Secondary | ICD-10-CM | POA: Diagnosis not present

## 2020-10-18 DIAGNOSIS — F3341 Major depressive disorder, recurrent, in partial remission: Secondary | ICD-10-CM | POA: Diagnosis not present

## 2020-10-18 DIAGNOSIS — F4321 Adjustment disorder with depressed mood: Secondary | ICD-10-CM | POA: Diagnosis not present

## 2020-10-25 DIAGNOSIS — F332 Major depressive disorder, recurrent severe without psychotic features: Secondary | ICD-10-CM | POA: Diagnosis not present

## 2020-10-29 DIAGNOSIS — F332 Major depressive disorder, recurrent severe without psychotic features: Secondary | ICD-10-CM | POA: Diagnosis not present

## 2020-11-01 DIAGNOSIS — F332 Major depressive disorder, recurrent severe without psychotic features: Secondary | ICD-10-CM | POA: Diagnosis not present

## 2020-11-03 DIAGNOSIS — F332 Major depressive disorder, recurrent severe without psychotic features: Secondary | ICD-10-CM | POA: Diagnosis not present

## 2020-11-04 DIAGNOSIS — F4323 Adjustment disorder with mixed anxiety and depressed mood: Secondary | ICD-10-CM | POA: Diagnosis not present

## 2020-11-10 DIAGNOSIS — F419 Anxiety disorder, unspecified: Secondary | ICD-10-CM | POA: Diagnosis not present

## 2020-11-10 DIAGNOSIS — F32A Depression, unspecified: Secondary | ICD-10-CM | POA: Diagnosis not present

## 2020-11-10 DIAGNOSIS — F332 Major depressive disorder, recurrent severe without psychotic features: Secondary | ICD-10-CM | POA: Diagnosis not present

## 2020-11-10 DIAGNOSIS — R110 Nausea: Secondary | ICD-10-CM | POA: Diagnosis not present

## 2020-11-15 DIAGNOSIS — F332 Major depressive disorder, recurrent severe without psychotic features: Secondary | ICD-10-CM | POA: Diagnosis not present

## 2020-11-16 DIAGNOSIS — F3342 Major depressive disorder, recurrent, in full remission: Secondary | ICD-10-CM | POA: Diagnosis not present

## 2020-11-17 DIAGNOSIS — F332 Major depressive disorder, recurrent severe without psychotic features: Secondary | ICD-10-CM | POA: Diagnosis not present

## 2020-11-22 DIAGNOSIS — F332 Major depressive disorder, recurrent severe without psychotic features: Secondary | ICD-10-CM | POA: Diagnosis not present

## 2020-11-25 DIAGNOSIS — H00012 Hordeolum externum right lower eyelid: Secondary | ICD-10-CM | POA: Diagnosis not present

## 2020-11-29 DIAGNOSIS — F332 Major depressive disorder, recurrent severe without psychotic features: Secondary | ICD-10-CM | POA: Diagnosis not present

## 2020-11-30 DIAGNOSIS — Z1231 Encounter for screening mammogram for malignant neoplasm of breast: Secondary | ICD-10-CM | POA: Diagnosis not present

## 2020-12-01 DIAGNOSIS — F332 Major depressive disorder, recurrent severe without psychotic features: Secondary | ICD-10-CM | POA: Diagnosis not present

## 2020-12-03 ENCOUNTER — Telehealth: Payer: Self-pay | Admitting: Gastroenterology

## 2020-12-03 NOTE — Telephone Encounter (Signed)
Good afternoon Dr. Adela Lank, we received a referral for patient.  She has being recently going to another GI but states would like a second opinion.  Records are in Epic.  Can you please review and advise on scheduling?  Thank you.

## 2020-12-03 NOTE — Telephone Encounter (Signed)
Is she specifically requesting me or [facility] any provider? She has had a very extensive evaluation and just was seen by [facility] a few weeks ago. If you can clarify who she wishes to see that would be helpful

## 2020-12-03 NOTE — Telephone Encounter (Signed)
Patient did specifically asked for you Dr. Adela Lank.

## 2020-12-07 DIAGNOSIS — F332 Major depressive disorder, recurrent severe without psychotic features: Secondary | ICD-10-CM | POA: Diagnosis not present

## 2020-12-07 NOTE — Telephone Encounter (Signed)
She has had an extensive workup to date, very thorough, without a clear cause. I am not sure how much I will add to the workup she has had thus far, but I can see her for a visit if she wishes to see me. [facility] has a plan in place for her, not sure if she wishes to give more time with their plan first.

## 2020-12-13 DIAGNOSIS — R634 Abnormal weight loss: Secondary | ICD-10-CM | POA: Diagnosis not present

## 2020-12-13 DIAGNOSIS — R110 Nausea: Secondary | ICD-10-CM | POA: Diagnosis not present

## 2020-12-13 DIAGNOSIS — Z681 Body mass index (BMI) 19 or less, adult: Secondary | ICD-10-CM | POA: Diagnosis not present

## 2020-12-14 ENCOUNTER — Encounter: Payer: Self-pay | Admitting: Gastroenterology

## 2020-12-14 NOTE — Telephone Encounter (Signed)
Patient is scheduled for 04/15/2020.

## 2020-12-15 DIAGNOSIS — F332 Major depressive disorder, recurrent severe without psychotic features: Secondary | ICD-10-CM | POA: Diagnosis not present

## 2020-12-15 DIAGNOSIS — H04123 Dry eye syndrome of bilateral lacrimal glands: Secondary | ICD-10-CM | POA: Diagnosis not present

## 2020-12-17 DIAGNOSIS — F332 Major depressive disorder, recurrent severe without psychotic features: Secondary | ICD-10-CM | POA: Diagnosis not present

## 2020-12-20 DIAGNOSIS — F332 Major depressive disorder, recurrent severe without psychotic features: Secondary | ICD-10-CM | POA: Diagnosis not present

## 2020-12-22 DIAGNOSIS — F332 Major depressive disorder, recurrent severe without psychotic features: Secondary | ICD-10-CM | POA: Diagnosis not present

## 2020-12-27 DIAGNOSIS — F332 Major depressive disorder, recurrent severe without psychotic features: Secondary | ICD-10-CM | POA: Diagnosis not present

## 2020-12-28 DIAGNOSIS — F411 Generalized anxiety disorder: Secondary | ICD-10-CM | POA: Diagnosis not present

## 2020-12-28 DIAGNOSIS — F321 Major depressive disorder, single episode, moderate: Secondary | ICD-10-CM | POA: Diagnosis not present

## 2020-12-29 DIAGNOSIS — F332 Major depressive disorder, recurrent severe without psychotic features: Secondary | ICD-10-CM | POA: Diagnosis not present

## 2021-01-03 DIAGNOSIS — F332 Major depressive disorder, recurrent severe without psychotic features: Secondary | ICD-10-CM | POA: Diagnosis not present

## 2021-01-04 DIAGNOSIS — F411 Generalized anxiety disorder: Secondary | ICD-10-CM | POA: Diagnosis not present

## 2021-01-04 DIAGNOSIS — D708 Other neutropenia: Secondary | ICD-10-CM | POA: Diagnosis not present

## 2021-01-04 DIAGNOSIS — F321 Major depressive disorder, single episode, moderate: Secondary | ICD-10-CM | POA: Diagnosis not present

## 2021-01-04 DIAGNOSIS — E876 Hypokalemia: Secondary | ICD-10-CM | POA: Diagnosis not present

## 2021-01-04 DIAGNOSIS — D696 Thrombocytopenia, unspecified: Secondary | ICD-10-CM | POA: Diagnosis not present

## 2021-01-04 DIAGNOSIS — R634 Abnormal weight loss: Secondary | ICD-10-CM | POA: Diagnosis not present

## 2021-01-05 DIAGNOSIS — D696 Thrombocytopenia, unspecified: Secondary | ICD-10-CM | POA: Diagnosis not present

## 2021-01-05 DIAGNOSIS — F332 Major depressive disorder, recurrent severe without psychotic features: Secondary | ICD-10-CM | POA: Diagnosis not present

## 2021-01-05 DIAGNOSIS — R7301 Impaired fasting glucose: Secondary | ICD-10-CM | POA: Diagnosis not present

## 2021-01-05 DIAGNOSIS — E876 Hypokalemia: Secondary | ICD-10-CM | POA: Diagnosis not present

## 2021-01-05 DIAGNOSIS — R634 Abnormal weight loss: Secondary | ICD-10-CM | POA: Diagnosis not present

## 2021-01-10 DIAGNOSIS — F332 Major depressive disorder, recurrent severe without psychotic features: Secondary | ICD-10-CM | POA: Diagnosis not present

## 2021-01-10 DIAGNOSIS — R634 Abnormal weight loss: Secondary | ICD-10-CM | POA: Diagnosis not present

## 2021-01-11 DIAGNOSIS — E876 Hypokalemia: Secondary | ICD-10-CM | POA: Diagnosis not present

## 2021-01-11 DIAGNOSIS — R634 Abnormal weight loss: Secondary | ICD-10-CM | POA: Diagnosis not present

## 2021-01-12 DIAGNOSIS — F332 Major depressive disorder, recurrent severe without psychotic features: Secondary | ICD-10-CM | POA: Diagnosis not present

## 2021-01-18 DIAGNOSIS — F411 Generalized anxiety disorder: Secondary | ICD-10-CM | POA: Diagnosis not present

## 2021-01-18 DIAGNOSIS — F321 Major depressive disorder, single episode, moderate: Secondary | ICD-10-CM | POA: Diagnosis not present

## 2021-01-19 DIAGNOSIS — K8681 Exocrine pancreatic insufficiency: Secondary | ICD-10-CM | POA: Insufficient documentation

## 2021-01-25 DIAGNOSIS — F321 Major depressive disorder, single episode, moderate: Secondary | ICD-10-CM | POA: Diagnosis not present

## 2021-01-25 DIAGNOSIS — F411 Generalized anxiety disorder: Secondary | ICD-10-CM | POA: Diagnosis not present

## 2021-01-26 DIAGNOSIS — F332 Major depressive disorder, recurrent severe without psychotic features: Secondary | ICD-10-CM | POA: Diagnosis not present

## 2021-02-02 DIAGNOSIS — F332 Major depressive disorder, recurrent severe without psychotic features: Secondary | ICD-10-CM | POA: Diagnosis not present

## 2021-02-03 ENCOUNTER — Ambulatory Visit: Payer: BC Managed Care – PPO | Admitting: Gastroenterology

## 2021-02-03 DIAGNOSIS — F321 Major depressive disorder, single episode, moderate: Secondary | ICD-10-CM | POA: Diagnosis not present

## 2021-02-03 DIAGNOSIS — F411 Generalized anxiety disorder: Secondary | ICD-10-CM | POA: Diagnosis not present

## 2021-02-08 DIAGNOSIS — F321 Major depressive disorder, single episode, moderate: Secondary | ICD-10-CM | POA: Diagnosis not present

## 2021-02-08 DIAGNOSIS — F411 Generalized anxiety disorder: Secondary | ICD-10-CM | POA: Diagnosis not present

## 2021-02-09 DIAGNOSIS — F332 Major depressive disorder, recurrent severe without psychotic features: Secondary | ICD-10-CM | POA: Diagnosis not present

## 2021-02-15 DIAGNOSIS — F321 Major depressive disorder, single episode, moderate: Secondary | ICD-10-CM | POA: Diagnosis not present

## 2021-02-15 DIAGNOSIS — F411 Generalized anxiety disorder: Secondary | ICD-10-CM | POA: Diagnosis not present

## 2021-02-16 DIAGNOSIS — F332 Major depressive disorder, recurrent severe without psychotic features: Secondary | ICD-10-CM | POA: Diagnosis not present

## 2021-02-21 DIAGNOSIS — D7589 Other specified diseases of blood and blood-forming organs: Secondary | ICD-10-CM | POA: Diagnosis not present

## 2021-02-21 DIAGNOSIS — R110 Nausea: Secondary | ICD-10-CM | POA: Diagnosis not present

## 2021-02-22 DIAGNOSIS — F321 Major depressive disorder, single episode, moderate: Secondary | ICD-10-CM | POA: Diagnosis not present

## 2021-02-22 DIAGNOSIS — F411 Generalized anxiety disorder: Secondary | ICD-10-CM | POA: Diagnosis not present

## 2021-02-23 DIAGNOSIS — F332 Major depressive disorder, recurrent severe without psychotic features: Secondary | ICD-10-CM | POA: Diagnosis not present

## 2021-03-02 DIAGNOSIS — F331 Major depressive disorder, recurrent, moderate: Secondary | ICD-10-CM | POA: Diagnosis not present

## 2021-03-07 DIAGNOSIS — R110 Nausea: Secondary | ICD-10-CM | POA: Diagnosis not present

## 2021-03-07 DIAGNOSIS — F332 Major depressive disorder, recurrent severe without psychotic features: Secondary | ICD-10-CM | POA: Diagnosis not present

## 2021-03-08 DIAGNOSIS — F321 Major depressive disorder, single episode, moderate: Secondary | ICD-10-CM | POA: Diagnosis not present

## 2021-03-08 DIAGNOSIS — F411 Generalized anxiety disorder: Secondary | ICD-10-CM | POA: Diagnosis not present

## 2021-03-09 DIAGNOSIS — F332 Major depressive disorder, recurrent severe without psychotic features: Secondary | ICD-10-CM | POA: Diagnosis not present

## 2021-03-14 DIAGNOSIS — F332 Major depressive disorder, recurrent severe without psychotic features: Secondary | ICD-10-CM | POA: Diagnosis not present

## 2021-03-17 DIAGNOSIS — F321 Major depressive disorder, single episode, moderate: Secondary | ICD-10-CM | POA: Diagnosis not present

## 2021-03-17 DIAGNOSIS — F411 Generalized anxiety disorder: Secondary | ICD-10-CM | POA: Diagnosis not present

## 2021-03-18 ENCOUNTER — Ambulatory Visit
Admission: RE | Admit: 2021-03-18 | Discharge: 2021-03-18 | Disposition: A | Discharge status: Home / Self Care | Payer: BC Managed Care – PPO | Source: Ambulatory Visit | Attending: Internal Medicine | Admitting: Internal Medicine

## 2021-03-18 ENCOUNTER — Other Ambulatory Visit: Payer: Self-pay | Admitting: Internal Medicine

## 2021-03-18 DIAGNOSIS — R053 Chronic cough: Secondary | ICD-10-CM | POA: Diagnosis not present

## 2021-03-18 DIAGNOSIS — R002 Palpitations: Secondary | ICD-10-CM | POA: Diagnosis not present

## 2021-03-18 DIAGNOSIS — G252 Other specified forms of tremor: Secondary | ICD-10-CM | POA: Diagnosis not present

## 2021-03-18 DIAGNOSIS — I951 Orthostatic hypotension: Secondary | ICD-10-CM | POA: Diagnosis not present

## 2021-03-22 DIAGNOSIS — F411 Generalized anxiety disorder: Secondary | ICD-10-CM | POA: Diagnosis not present

## 2021-03-22 DIAGNOSIS — F321 Major depressive disorder, single episode, moderate: Secondary | ICD-10-CM | POA: Diagnosis not present

## 2021-03-23 DIAGNOSIS — F332 Major depressive disorder, recurrent severe without psychotic features: Secondary | ICD-10-CM | POA: Diagnosis not present

## 2021-03-30 DIAGNOSIS — F332 Major depressive disorder, recurrent severe without psychotic features: Secondary | ICD-10-CM | POA: Diagnosis not present

## 2021-04-05 DIAGNOSIS — E876 Hypokalemia: Secondary | ICD-10-CM | POA: Diagnosis not present

## 2021-04-06 DIAGNOSIS — F3341 Major depressive disorder, recurrent, in partial remission: Secondary | ICD-10-CM | POA: Diagnosis not present

## 2021-04-06 DIAGNOSIS — F341 Dysthymic disorder: Secondary | ICD-10-CM | POA: Diagnosis not present

## 2021-04-07 DIAGNOSIS — R251 Tremor, unspecified: Secondary | ICD-10-CM | POA: Diagnosis not present

## 2021-04-07 DIAGNOSIS — R110 Nausea: Secondary | ICD-10-CM | POA: Diagnosis not present

## 2021-04-07 DIAGNOSIS — F321 Major depressive disorder, single episode, moderate: Secondary | ICD-10-CM | POA: Diagnosis not present

## 2021-04-07 DIAGNOSIS — F411 Generalized anxiety disorder: Secondary | ICD-10-CM | POA: Diagnosis not present

## 2021-04-08 ENCOUNTER — Other Ambulatory Visit: Payer: Self-pay | Admitting: Internal Medicine

## 2021-04-08 DIAGNOSIS — R251 Tremor, unspecified: Secondary | ICD-10-CM

## 2021-04-12 DIAGNOSIS — F411 Generalized anxiety disorder: Secondary | ICD-10-CM | POA: Diagnosis not present

## 2021-04-12 DIAGNOSIS — F321 Major depressive disorder, single episode, moderate: Secondary | ICD-10-CM | POA: Diagnosis not present

## 2021-04-13 DIAGNOSIS — F332 Major depressive disorder, recurrent severe without psychotic features: Secondary | ICD-10-CM | POA: Diagnosis not present

## 2021-04-19 DIAGNOSIS — F321 Major depressive disorder, single episode, moderate: Secondary | ICD-10-CM | POA: Diagnosis not present

## 2021-04-19 DIAGNOSIS — F411 Generalized anxiety disorder: Secondary | ICD-10-CM | POA: Diagnosis not present

## 2021-04-20 ENCOUNTER — Other Ambulatory Visit: Payer: BC Managed Care – PPO

## 2021-05-06 ENCOUNTER — Ambulatory Visit
Admission: RE | Admit: 2021-05-06 | Discharge: 2021-05-06 | Disposition: A | Discharge status: Home / Self Care | Payer: BC Managed Care – PPO | Source: Ambulatory Visit | Attending: Internal Medicine | Admitting: Internal Medicine

## 2021-05-06 DIAGNOSIS — J329 Chronic sinusitis, unspecified: Secondary | ICD-10-CM | POA: Diagnosis not present

## 2021-05-06 DIAGNOSIS — G250 Essential tremor: Secondary | ICD-10-CM | POA: Diagnosis not present

## 2021-05-06 DIAGNOSIS — R42 Dizziness and giddiness: Secondary | ICD-10-CM | POA: Diagnosis not present

## 2021-05-06 DIAGNOSIS — R251 Tremor, unspecified: Secondary | ICD-10-CM

## 2021-05-06 DIAGNOSIS — J341 Cyst and mucocele of nose and nasal sinus: Secondary | ICD-10-CM | POA: Diagnosis not present

## 2021-05-13 DIAGNOSIS — J329 Chronic sinusitis, unspecified: Secondary | ICD-10-CM | POA: Diagnosis not present

## 2021-05-13 DIAGNOSIS — E876 Hypokalemia: Secondary | ICD-10-CM | POA: Diagnosis not present

## 2021-05-13 DIAGNOSIS — R0789 Other chest pain: Secondary | ICD-10-CM | POA: Diagnosis not present

## 2021-05-14 DIAGNOSIS — F341 Dysthymic disorder: Secondary | ICD-10-CM | POA: Diagnosis not present

## 2021-05-14 DIAGNOSIS — F3341 Major depressive disorder, recurrent, in partial remission: Secondary | ICD-10-CM | POA: Diagnosis not present

## 2021-05-16 ENCOUNTER — Ambulatory Visit: Payer: BC Managed Care – PPO | Admitting: Neurology

## 2021-05-16 ENCOUNTER — Other Ambulatory Visit: Payer: Self-pay

## 2021-05-16 ENCOUNTER — Encounter: Payer: Self-pay | Admitting: Neurology

## 2021-05-16 VITALS — BP 114/76 | HR 84 | Ht 60.0 in | Wt 104.5 lb

## 2021-05-16 DIAGNOSIS — F32A Depression, unspecified: Secondary | ICD-10-CM

## 2021-05-16 DIAGNOSIS — E559 Vitamin D deficiency, unspecified: Secondary | ICD-10-CM | POA: Insufficient documentation

## 2021-05-16 DIAGNOSIS — R7301 Impaired fasting glucose: Secondary | ICD-10-CM | POA: Insufficient documentation

## 2021-05-16 DIAGNOSIS — G9332 Myalgic encephalomyelitis/chronic fatigue syndrome: Secondary | ICD-10-CM | POA: Insufficient documentation

## 2021-05-16 DIAGNOSIS — J4540 Moderate persistent asthma, uncomplicated: Secondary | ICD-10-CM | POA: Insufficient documentation

## 2021-05-16 DIAGNOSIS — K9041 Non-celiac gluten sensitivity: Secondary | ICD-10-CM | POA: Insufficient documentation

## 2021-05-16 DIAGNOSIS — F331 Major depressive disorder, recurrent, moderate: Secondary | ICD-10-CM | POA: Insufficient documentation

## 2021-05-16 DIAGNOSIS — M1990 Unspecified osteoarthritis, unspecified site: Secondary | ICD-10-CM | POA: Insufficient documentation

## 2021-05-16 DIAGNOSIS — R251 Tremor, unspecified: Secondary | ICD-10-CM

## 2021-05-16 DIAGNOSIS — G43709 Chronic migraine without aura, not intractable, without status migrainosus: Secondary | ICD-10-CM | POA: Diagnosis not present

## 2021-05-16 DIAGNOSIS — F5101 Primary insomnia: Secondary | ICD-10-CM | POA: Insufficient documentation

## 2021-05-16 DIAGNOSIS — F411 Generalized anxiety disorder: Secondary | ICD-10-CM | POA: Insufficient documentation

## 2021-05-16 DIAGNOSIS — I10 Essential (primary) hypertension: Secondary | ICD-10-CM | POA: Insufficient documentation

## 2021-05-16 DIAGNOSIS — D509 Iron deficiency anemia, unspecified: Secondary | ICD-10-CM | POA: Insufficient documentation

## 2021-05-16 DIAGNOSIS — D7589 Other specified diseases of blood and blood-forming organs: Secondary | ICD-10-CM | POA: Insufficient documentation

## 2021-05-16 DIAGNOSIS — D709 Neutropenia, unspecified: Secondary | ICD-10-CM | POA: Insufficient documentation

## 2021-05-16 DIAGNOSIS — J329 Chronic sinusitis, unspecified: Secondary | ICD-10-CM | POA: Insufficient documentation

## 2021-05-16 DIAGNOSIS — E876 Hypokalemia: Secondary | ICD-10-CM | POA: Insufficient documentation

## 2021-05-16 DIAGNOSIS — R110 Nausea: Secondary | ICD-10-CM | POA: Insufficient documentation

## 2021-05-16 DIAGNOSIS — J310 Chronic rhinitis: Secondary | ICD-10-CM | POA: Insufficient documentation

## 2021-05-16 DIAGNOSIS — R634 Abnormal weight loss: Secondary | ICD-10-CM | POA: Insufficient documentation

## 2021-05-16 NOTE — Progress Notes (Signed)
Chief Complaint  Patient presents with   New Patient (Initial Visit)    Rm 15. Alone. 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 Approx 2 months ago she began have dizzy spells and fainting. States tremors are sometimes in bilateral arms and legs. Pt c/o severe frontal headache for approx 9 days.      ASSESSMENT AND PLAN  Maria Burnett is a 51 y.o. female   Persistent nausea, lack of appetite  In the setting of depression anxiety,  Normal neurological examination,  Normal CT head without contrast  No neurological etiology found on today's examination, will continue follow-up with her primary care physician  DIAGNOSTIC DATA (LABS, IMAGING, TESTING) - I reviewed patient records, labs, notes, testing and imaging myself where available. I personally reviewed CT head without contrast May 06, 2021 that was normal  Laboratory evaluations in 2022: Normal CMP with exception of slightly elevated glucose 132, CBC showed WBC of 2.7, platelet of 88, normal folic acid, Y19, ferritin,  MEDICAL HISTORY:  Maria Burnett, is a 51 year old female seen in request by her primary care doctor Maria Burnett, for evaluation of hand tremor, chronic nausea, lack of appetite  I reviewed and summarized the referring note. PMHx Depression, anxiety  Patient reported that her depression anxiety overall is under reasonable control taking current Trintellix 20 mg daily  But she began to have nausea vomiting profound GI symptoms since 2020, over the years, she was seen by different specialist, including GI specialist, reported normal repeating test including endoscopy, ultrasound, CT of abdomen  She was also found to have mildly decreased platelet count, was seen by hematologist, bone marrow biopsy showed no significant abnormality  She has been taking Zofran up to 3 times a day as  needed, still few nausea, no appetite, she also complains of dizziness, fainting sensation, has to receive some IV fluid.  It being  She denies dysarthria, no dysphagia, not feeling well, but no persistent muscle weakness, intermittent tremors sensation since December 2022,    PHYSICAL EXAM:   Vitals:   05/16/21 1055  BP: 114/76  Pulse: 84  Weight: 104 lb 8 oz (47.4 kg)  Height: 5' (1.524 m)   Not recorded     Body mass index is 20.41 kg/m.  PHYSICAL EXAMNIATION:  Gen: NAD, conversant, well nourised, well groomed                     Cardiovascular: Regular rate rhythm, no peripheral edema, warm, nontender. Eyes: Conjunctivae clear without exudates or hemorrhage Neck: Supple, no carotid bruits. Pulmonary: Clear to auscultation bilaterally   NEUROLOGICAL EXAM:  MENTAL STATUS: Anxious looking middle-aged female Speech:    Speech is normal; fluent and spontaneous with normal comprehension.  Cognition:     Orientation to time, place and person     Normal recent and remote memory     Normal Attention span and concentration     Normal Language, naming, repeating,spontaneous speech     Fund of knowledge   CRANIAL NERVES: CN II: Visual fields are full to confrontation. Pupils are round equal and briskly reactive to light. CN III, IV, VI: extraocular movement are normal. No ptosis. CN V: Facial sensation is intact to light touch CN VII: Face is symmetric with normal eye closure  CN VIII: Hearing is normal to causal conversation. CN IX, X: Phonation is normal. CN XI: Head  turning and shoulder shrug are intact  MOTOR: There is no pronator drift of out-stretched arms. Muscle bulk and tone are normal. Muscle strength is normal.  REFLEXES: Reflexes are 2+ and symmetric at the biceps, triceps, knees, and ankles. Plantar responses are flexor.  SENSORY: Intact to light touch, pinprick and vibratory sensation are intact in fingers and toes.  COORDINATION: There is no trunk  or limb dysmetria noted.  GAIT/STANCE: Posture is normal. Gait is steady with normal steps, base, arm swing, and turning. Heel and toe walking are normal. Tandem gait is normal.  Romberg is absent.  REVIEW OF SYSTEMS:  Full 14 system review of systems performed and notable only for as above All other review of systems were negative.   ALLERGIES: Allergies  Allergen Reactions   Eggs Or Egg-Derived Products Other (See Comments)   Other Other (See Comments)    Propanol-worsens asthma   Prednisone Other (See Comments)    Blurred vision   Propranolol Hcl Other (See Comments)    HOME MEDICATIONS: Current Outpatient Medications  Medication Sig Dispense Refill   Albuterol Sulfate, sensor, (PROAIR DIGIHALER) 108 (90 Base) MCG/ACT AEPB Inhale into the lungs.     Budesonide-Formoterol Fumarate (SYMBICORT IN) Inhale into the lungs.     cetirizine (ZYRTEC) 10 MG tablet Take by mouth.     clonazePAM (KLONOPIN) 1 MG tablet Take 1 mg by mouth 3 (three) times daily as needed.     CREON 36000-114000 units CPEP capsule Take 36,000 Units by mouth 4 (four) times daily.     doxycycline (VIBRA-TABS) 100 MG tablet Take 1 tablet by mouth 2 (two) times daily.     Esketamine HCl, 56 MG Dose, (SPRAVATO, 56 MG DOSE,) 28 MG/DEVICE SOPK Place into the nose.     Multiple Vitamins-Minerals (MULTIVITAMIN PO) Take by mouth daily.     ondansetron (ZOFRAN-ODT) 8 MG disintegrating tablet Take 1 tablet by mouth 3 (three) times daily as needed.     vortioxetine HBr (TRINTELLIX) 20 MG TABS tablet      No current facility-administered medications for this visit.    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    Meniere's disease    Migraine    Perimenopause    "premature ovarian failure"   PTSD (post-traumatic stress disorder)    Vertigo      PAST SURGICAL HISTORY: Past Surgical History:  Procedure Laterality Date   HIATAL HERNIA REPAIR     OVARIAN CYST REMOVAL      FAMILY HISTORY: Family History  Problem Relation Age of Onset   Hypertension Mother    Lung disease Father    Alcoholism Father    Hyperlipidemia Father    Breast cancer Sister     SOCIAL HISTORY: Social History   Socioeconomic History   Marital status: Single    Spouse name: Not on file   Number of children: 0   Years of education: Bachelors   Highest education level: Not on file  Occupational History   Occupation: Self-employed - rental/sales  Tobacco Use   Smoking status: Never   Smokeless tobacco: Never   Tobacco comments:    Not smoked since her 68s - only 2 cigarettes per day for about one year.  Substance and Sexual Activity   Alcohol use: Yes  Comment: Social - 1-2 glasses of wine    Drug use: No   Sexual activity: Not on file  Other Topics Concern   Not on file  Social History Narrative   Lives at home alone.   Right-handed.   Rare caffeine use.   Social Determinants of Health   Financial Resource Strain: Not on file  Food Insecurity: Not on file  Transportation Needs: Not on file  Physical Activity: Not on file  Stress: Not on file  Social Connections: Not on file  Intimate Partner Violence: Not on file      Marcial Pacas, M.D. Ph.D.  [facility] [address], [place], [place] [address] Ph: [phone] Fax: [phone]  CC:  Maria Ferretti, MD [address] [place],  [place] [address]  Maria Ferretti, MD

## 2021-05-17 DIAGNOSIS — F411 Generalized anxiety disorder: Secondary | ICD-10-CM | POA: Diagnosis not present

## 2021-05-17 DIAGNOSIS — F321 Major depressive disorder, single episode, moderate: Secondary | ICD-10-CM | POA: Diagnosis not present

## 2021-06-02 DIAGNOSIS — F321 Major depressive disorder, single episode, moderate: Secondary | ICD-10-CM | POA: Diagnosis not present

## 2021-06-02 DIAGNOSIS — F411 Generalized anxiety disorder: Secondary | ICD-10-CM | POA: Diagnosis not present

## 2021-06-18 DIAGNOSIS — N390 Urinary tract infection, site not specified: Secondary | ICD-10-CM | POA: Diagnosis not present

## 2021-06-18 DIAGNOSIS — Z03818 Encounter for observation for suspected exposure to other biological agents ruled out: Secondary | ICD-10-CM | POA: Diagnosis not present

## 2021-06-18 DIAGNOSIS — B349 Viral infection, unspecified: Secondary | ICD-10-CM | POA: Diagnosis not present

## 2021-09-06 DIAGNOSIS — F332 Major depressive disorder, recurrent severe without psychotic features: Secondary | ICD-10-CM | POA: Diagnosis not present

## 2021-09-06 DIAGNOSIS — F3341 Major depressive disorder, recurrent, in partial remission: Secondary | ICD-10-CM | POA: Diagnosis not present

## 2021-09-06 DIAGNOSIS — F341 Dysthymic disorder: Secondary | ICD-10-CM | POA: Diagnosis not present

## 2021-09-12 DIAGNOSIS — F332 Major depressive disorder, recurrent severe without psychotic features: Secondary | ICD-10-CM | POA: Diagnosis not present

## 2021-09-16 DIAGNOSIS — F332 Major depressive disorder, recurrent severe without psychotic features: Secondary | ICD-10-CM | POA: Diagnosis not present

## 2021-09-19 DIAGNOSIS — F332 Major depressive disorder, recurrent severe without psychotic features: Secondary | ICD-10-CM | POA: Diagnosis not present

## 2021-09-28 DIAGNOSIS — F332 Major depressive disorder, recurrent severe without psychotic features: Secondary | ICD-10-CM | POA: Diagnosis not present

## 2021-10-03 DIAGNOSIS — F332 Major depressive disorder, recurrent severe without psychotic features: Secondary | ICD-10-CM | POA: Diagnosis not present

## 2021-10-06 DIAGNOSIS — F332 Major depressive disorder, recurrent severe without psychotic features: Secondary | ICD-10-CM | POA: Diagnosis not present

## 2021-10-19 DIAGNOSIS — F332 Major depressive disorder, recurrent severe without psychotic features: Secondary | ICD-10-CM | POA: Diagnosis not present

## 2021-10-21 DIAGNOSIS — F332 Major depressive disorder, recurrent severe without psychotic features: Secondary | ICD-10-CM | POA: Diagnosis not present

## 2021-11-11 DIAGNOSIS — F332 Major depressive disorder, recurrent severe without psychotic features: Secondary | ICD-10-CM | POA: Diagnosis not present

## 2021-11-14 DIAGNOSIS — F332 Major depressive disorder, recurrent severe without psychotic features: Secondary | ICD-10-CM | POA: Diagnosis not present

## 2021-11-21 DIAGNOSIS — F332 Major depressive disorder, recurrent severe without psychotic features: Secondary | ICD-10-CM | POA: Diagnosis not present

## 2021-11-28 DIAGNOSIS — F332 Major depressive disorder, recurrent severe without psychotic features: Secondary | ICD-10-CM | POA: Diagnosis not present

## 2021-11-30 DIAGNOSIS — F332 Major depressive disorder, recurrent severe without psychotic features: Secondary | ICD-10-CM | POA: Diagnosis not present

## 2021-12-07 DIAGNOSIS — F332 Major depressive disorder, recurrent severe without psychotic features: Secondary | ICD-10-CM | POA: Diagnosis not present

## 2021-12-12 DIAGNOSIS — F332 Major depressive disorder, recurrent severe without psychotic features: Secondary | ICD-10-CM | POA: Diagnosis not present

## 2021-12-19 DIAGNOSIS — F332 Major depressive disorder, recurrent severe without psychotic features: Secondary | ICD-10-CM | POA: Diagnosis not present

## 2021-12-21 DIAGNOSIS — H35432 Paving stone degeneration of retina, left eye: Secondary | ICD-10-CM | POA: Diagnosis not present

## 2021-12-22 DIAGNOSIS — F332 Major depressive disorder, recurrent severe without psychotic features: Secondary | ICD-10-CM | POA: Diagnosis not present

## 2021-12-26 DIAGNOSIS — F332 Major depressive disorder, recurrent severe without psychotic features: Secondary | ICD-10-CM | POA: Diagnosis not present

## 2022-01-25 DIAGNOSIS — F332 Major depressive disorder, recurrent severe without psychotic features: Secondary | ICD-10-CM | POA: Diagnosis not present

## 2022-01-26 DIAGNOSIS — F332 Major depressive disorder, recurrent severe without psychotic features: Secondary | ICD-10-CM | POA: Diagnosis not present

## 2022-02-02 DIAGNOSIS — F332 Major depressive disorder, recurrent severe without psychotic features: Secondary | ICD-10-CM | POA: Diagnosis not present

## 2022-02-08 DIAGNOSIS — F332 Major depressive disorder, recurrent severe without psychotic features: Secondary | ICD-10-CM | POA: Diagnosis not present

## 2022-03-01 DIAGNOSIS — F332 Major depressive disorder, recurrent severe without psychotic features: Secondary | ICD-10-CM | POA: Diagnosis not present

## 2022-03-08 DIAGNOSIS — F332 Major depressive disorder, recurrent severe without psychotic features: Secondary | ICD-10-CM | POA: Diagnosis not present

## 2022-03-15 DIAGNOSIS — F332 Major depressive disorder, recurrent severe without psychotic features: Secondary | ICD-10-CM | POA: Diagnosis not present

## 2022-03-22 DIAGNOSIS — F332 Major depressive disorder, recurrent severe without psychotic features: Secondary | ICD-10-CM | POA: Diagnosis not present

## 2022-03-30 DIAGNOSIS — F332 Major depressive disorder, recurrent severe without psychotic features: Secondary | ICD-10-CM | POA: Diagnosis not present

## 2022-04-04 DIAGNOSIS — R319 Hematuria, unspecified: Secondary | ICD-10-CM | POA: Diagnosis not present

## 2022-04-04 DIAGNOSIS — N390 Urinary tract infection, site not specified: Secondary | ICD-10-CM | POA: Diagnosis not present

## 2022-04-04 DIAGNOSIS — H1033 Unspecified acute conjunctivitis, bilateral: Secondary | ICD-10-CM | POA: Diagnosis not present

## 2022-04-19 ENCOUNTER — Telehealth: Payer: Self-pay | Admitting: *Deleted

## 2022-04-19 DIAGNOSIS — F332 Major depressive disorder, recurrent severe without psychotic features: Secondary | ICD-10-CM | POA: Diagnosis not present

## 2022-04-19 NOTE — Patient Outreach (Signed)
  Care Coordination   04/19/2022 Name: Maria Burnett MRN: [id number] DOB: 1970/07/15   Care Coordination Outreach Attempts:  An unsuccessful telephone outreach was attempted today to offer the patient information about available care coordination services as a benefit of their health plan.   Follow Up Plan:  Additional outreach attempts will be made to offer the patient care coordination information and services.   Encounter Outcome:  No Answer   Care Coordination Interventions:  No, not indicated    Raina Mina, RN Care Management Coordinator [place] [facility] [phone]

## 2022-04-20 DIAGNOSIS — G44209 Tension-type headache, unspecified, not intractable: Secondary | ICD-10-CM | POA: Diagnosis not present

## 2022-04-20 DIAGNOSIS — R0789 Other chest pain: Secondary | ICD-10-CM | POA: Diagnosis not present

## 2022-04-20 DIAGNOSIS — R112 Nausea with vomiting, unspecified: Secondary | ICD-10-CM | POA: Diagnosis not present

## 2022-04-20 DIAGNOSIS — R002 Palpitations: Secondary | ICD-10-CM | POA: Diagnosis not present

## 2022-04-26 DIAGNOSIS — F332 Major depressive disorder, recurrent severe without psychotic features: Secondary | ICD-10-CM | POA: Diagnosis not present

## 2022-05-02 DIAGNOSIS — R002 Palpitations: Secondary | ICD-10-CM | POA: Diagnosis not present

## 2022-05-08 DIAGNOSIS — R002 Palpitations: Secondary | ICD-10-CM | POA: Diagnosis not present

## 2022-05-16 DIAGNOSIS — F332 Major depressive disorder, recurrent severe without psychotic features: Secondary | ICD-10-CM | POA: Diagnosis not present

## 2022-05-24 DIAGNOSIS — F332 Major depressive disorder, recurrent severe without psychotic features: Secondary | ICD-10-CM | POA: Diagnosis not present

## 2022-06-02 DIAGNOSIS — F332 Major depressive disorder, recurrent severe without psychotic features: Secondary | ICD-10-CM | POA: Diagnosis not present

## 2022-06-05 DIAGNOSIS — Z124 Encounter for screening for malignant neoplasm of cervix: Secondary | ICD-10-CM | POA: Diagnosis not present

## 2022-06-05 DIAGNOSIS — R748 Abnormal levels of other serum enzymes: Secondary | ICD-10-CM | POA: Diagnosis not present

## 2022-06-05 DIAGNOSIS — Z0000 Encounter for general adult medical examination without abnormal findings: Secondary | ICD-10-CM | POA: Diagnosis not present

## 2022-06-05 DIAGNOSIS — E7800 Pure hypercholesterolemia, unspecified: Secondary | ICD-10-CM | POA: Diagnosis not present

## 2022-06-05 DIAGNOSIS — N760 Acute vaginitis: Secondary | ICD-10-CM | POA: Diagnosis not present

## 2022-06-09 DIAGNOSIS — F332 Major depressive disorder, recurrent severe without psychotic features: Secondary | ICD-10-CM | POA: Diagnosis not present

## 2022-06-15 DIAGNOSIS — F332 Major depressive disorder, recurrent severe without psychotic features: Secondary | ICD-10-CM | POA: Diagnosis not present

## 2022-06-19 DIAGNOSIS — F332 Major depressive disorder, recurrent severe without psychotic features: Secondary | ICD-10-CM | POA: Diagnosis not present

## 2022-06-20 ENCOUNTER — Ambulatory Visit: Payer: BC Managed Care – PPO | Admitting: Podiatry

## 2022-06-21 DIAGNOSIS — F332 Major depressive disorder, recurrent severe without psychotic features: Secondary | ICD-10-CM | POA: Diagnosis not present

## 2022-06-26 DIAGNOSIS — F332 Major depressive disorder, recurrent severe without psychotic features: Secondary | ICD-10-CM | POA: Diagnosis not present

## 2022-07-17 DIAGNOSIS — F332 Major depressive disorder, recurrent severe without psychotic features: Secondary | ICD-10-CM | POA: Diagnosis not present

## 2022-07-24 DIAGNOSIS — F332 Major depressive disorder, recurrent severe without psychotic features: Secondary | ICD-10-CM | POA: Diagnosis not present

## 2022-07-25 DIAGNOSIS — F5101 Primary insomnia: Secondary | ICD-10-CM | POA: Diagnosis not present

## 2022-07-25 DIAGNOSIS — F332 Major depressive disorder, recurrent severe without psychotic features: Secondary | ICD-10-CM | POA: Diagnosis not present

## 2022-08-09 DIAGNOSIS — F332 Major depressive disorder, recurrent severe without psychotic features: Secondary | ICD-10-CM | POA: Diagnosis not present

## 2022-08-22 DIAGNOSIS — F331 Major depressive disorder, recurrent, moderate: Secondary | ICD-10-CM | POA: Diagnosis not present

## 2022-08-22 DIAGNOSIS — F5101 Primary insomnia: Secondary | ICD-10-CM | POA: Diagnosis not present

## 2022-10-12 DIAGNOSIS — R635 Abnormal weight gain: Secondary | ICD-10-CM | POA: Diagnosis not present

## 2022-10-12 DIAGNOSIS — R197 Diarrhea, unspecified: Secondary | ICD-10-CM | POA: Diagnosis not present

## 2022-10-12 DIAGNOSIS — K8681 Exocrine pancreatic insufficiency: Secondary | ICD-10-CM | POA: Diagnosis not present

## 2022-10-12 DIAGNOSIS — R110 Nausea: Secondary | ICD-10-CM | POA: Diagnosis not present

## 2023-02-16 DIAGNOSIS — F332 Major depressive disorder, recurrent severe without psychotic features: Secondary | ICD-10-CM | POA: Diagnosis not present

## 2023-02-16 DIAGNOSIS — F5101 Primary insomnia: Secondary | ICD-10-CM | POA: Diagnosis not present

## 2023-03-25 IMAGING — CT CT BIOPSY AND ASPIRATION BONE MARROW
1 of 2 series · 15 of 28 positions shown, 19 images · non-contrast
Comparison: none

INDICATION: 49-year-old female referred for bone marrow biopsy

[Series 2: i-spiral 5.0 br59 · axial · 0.98mm/px · z∈[-146,-51]mm · 15 of 31 slices shown, 19 images]
[im 2/31  mediastinal]
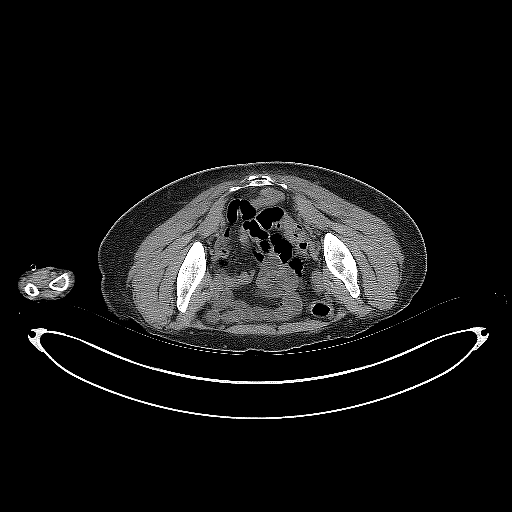
[im 2/31  lung]
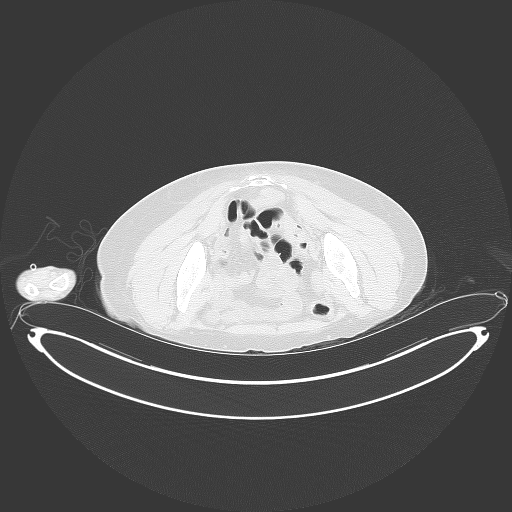
[im 4/31  lung]
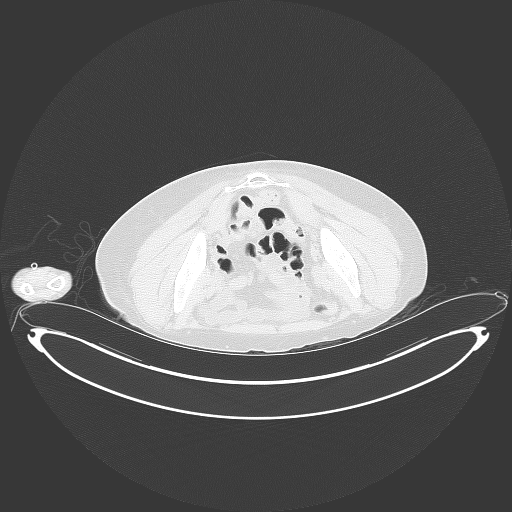
[im 7/31  lung]
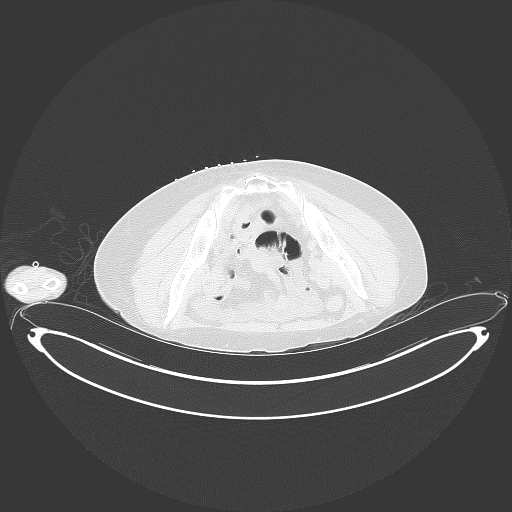
[im 8/31  lung]
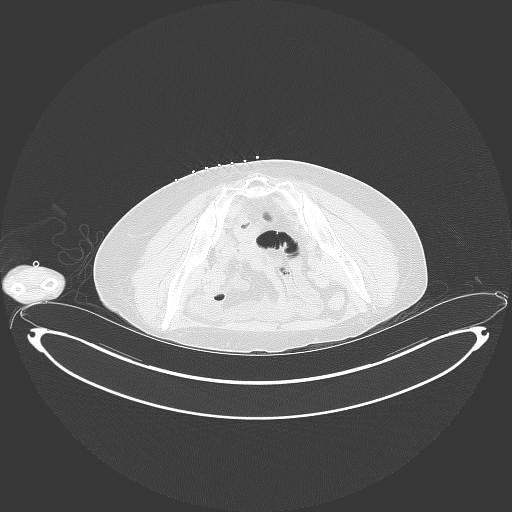
[im 10/31  mediastinal]
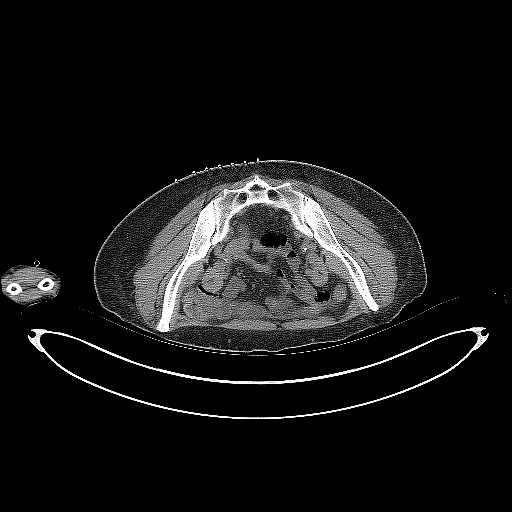
[im 10/31  lung]
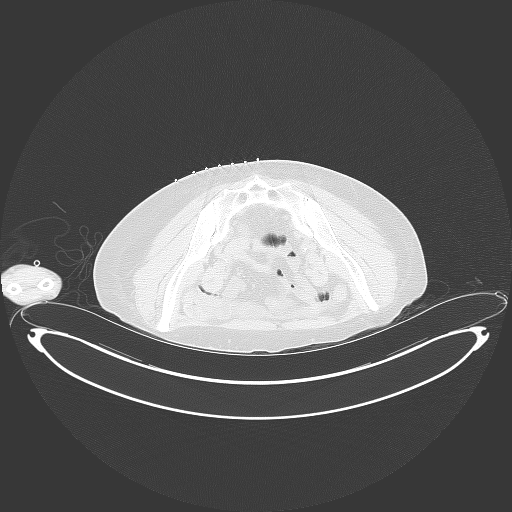
[im 11/31  lung]
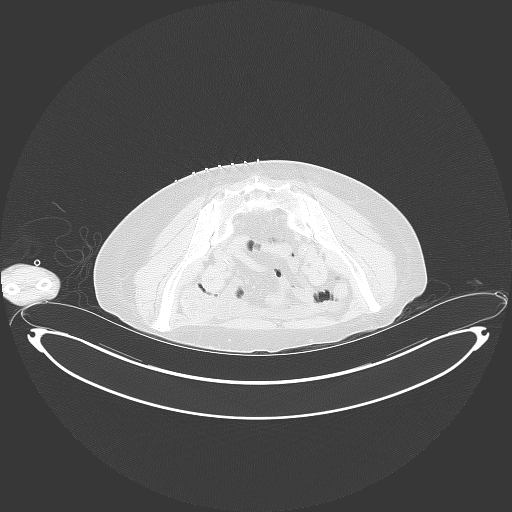
[im 14/31  lung]
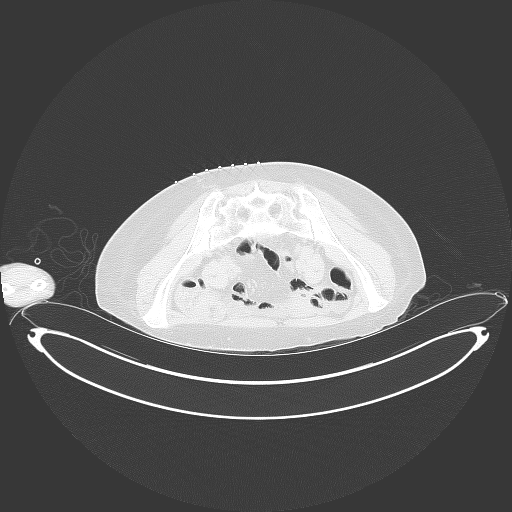
[im 16/31  lung]
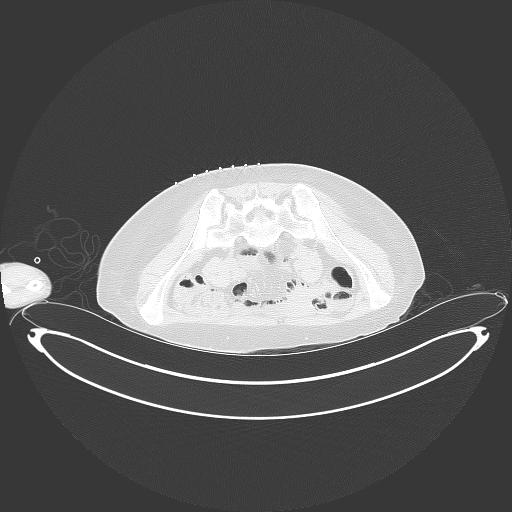
[im 17/31  mediastinal]
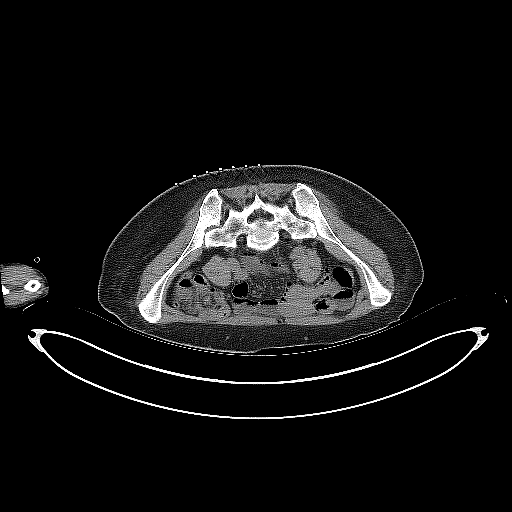
[im 17/31  lung]
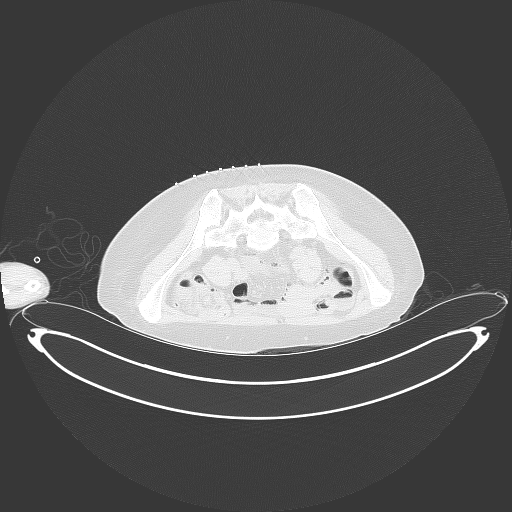
[im 20/31  lung]
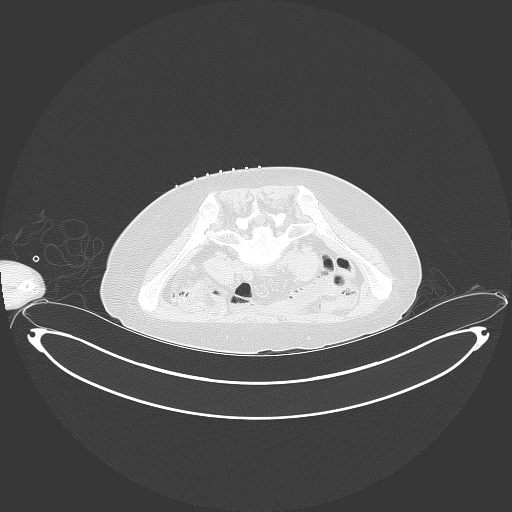
[im 21/31  lung]
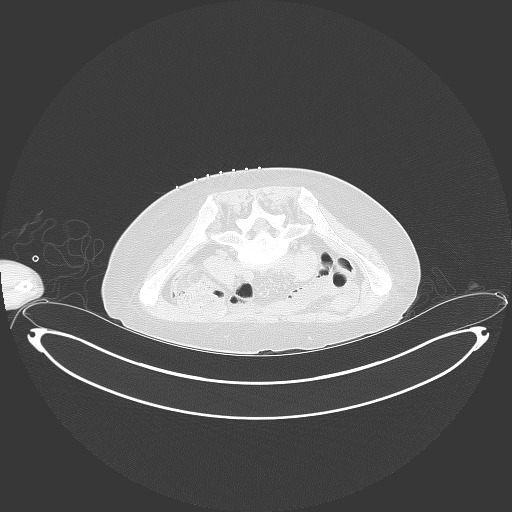
[im 23/31  lung]
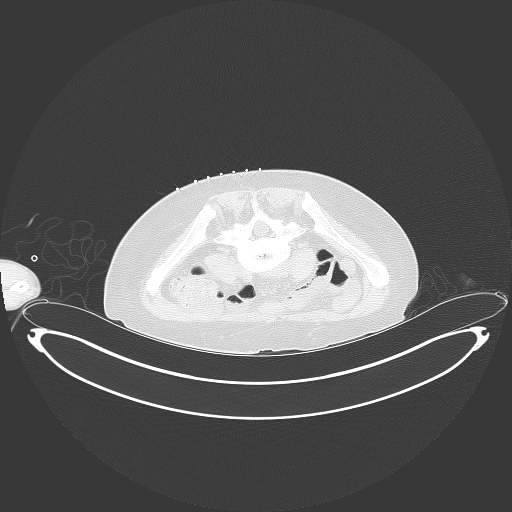
[im 26/31  mediastinal]
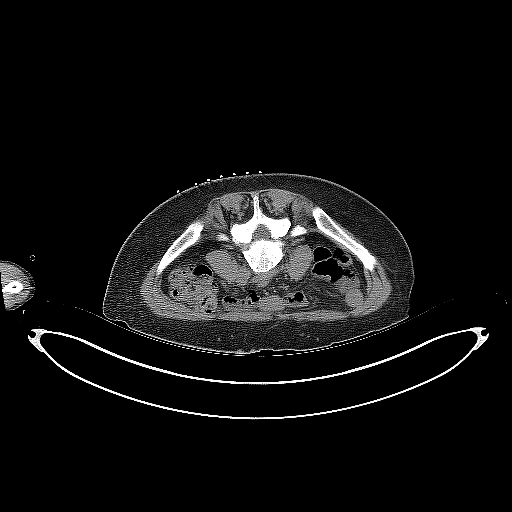
[im 26/31  lung]
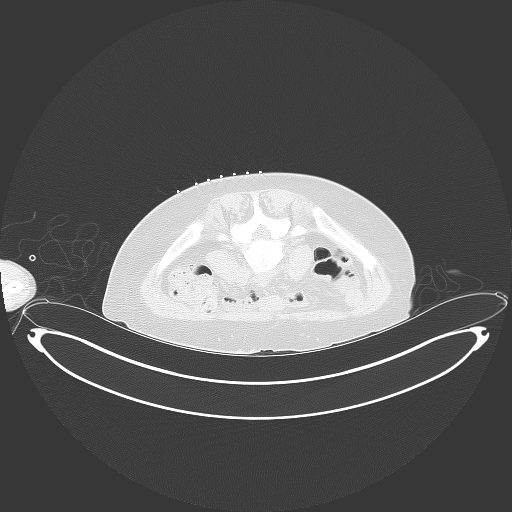
[im 27/31  lung]
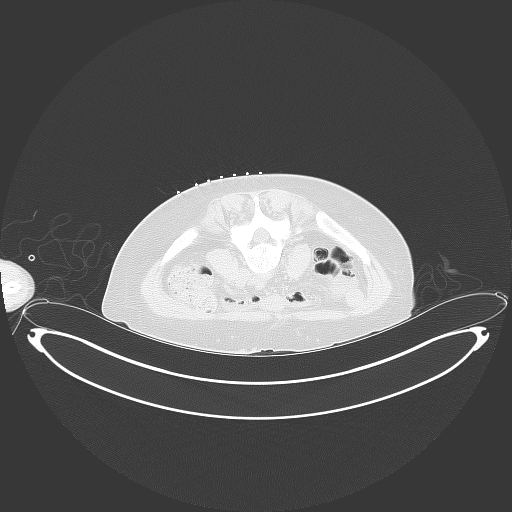
[im 29/31  lung]
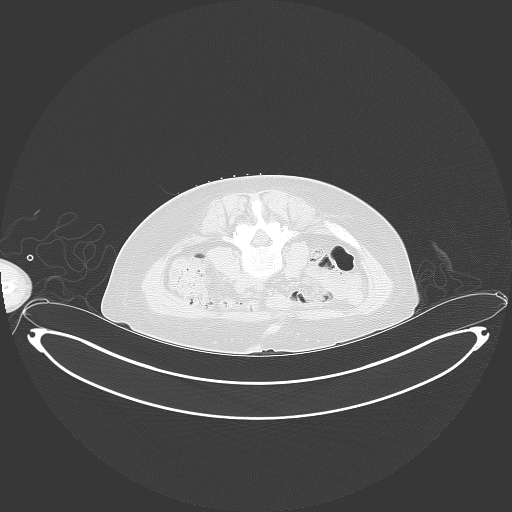

[15 of 28 positions shown; findings below may reference images not displayed]

EXAM:
CT BONE MARROW BIOPSY AND ASPIRATION; CT BIOPSY

MEDICATIONS:
None.

ANESTHESIA/SEDATION:
Moderate (conscious) sedation was employed during this procedure. A
total of Versed 4.0 mg and Fentanyl 100 mcg was administered
intravenously.

Moderate Sedation Time: 10 minutes. The patient's level of
consciousness and vital signs were monitored continuously by
radiology nursing throughout the procedure under my direct
supervision.

FLUOROSCOPY TIME:  CT

COMPLICATIONS:
None

PROCEDURE:
The procedure risks, benefits, and alternatives were explained to
the patient. Questions regarding the procedure were encouraged and
answered. The patient understands and consents to the procedure.

Scout CT of the pelvis was performed for surgical planning purposes.

The posterior pelvis was prepped with Chlorhexidine in a sterile
fashion, and a sterile drape was applied covering the operative
field. A sterile gown and sterile gloves were used for the
procedure. Local anesthesia was provided with 1% Lidocaine.

Posterior right iliac bone was targeted for biopsy. The skin and
subcutaneous tissues were infiltrated with 1% lidocaine without
epinephrine. A small stab incision was made with an 11 blade
scalpel, and an 11 gauge Bolagay needle was advanced with CT guidance
to the posterior cortex. Manual forced was used to advance the
needle through the posterior cortex and the stylet was removed. A
bone marrow aspirate was retrieved and passed to a cytotechnologist
in the room. The Bolagay needle was then advanced without the stylet
for a core biopsy. The core biopsy was retrieved and also passed to
a cytotechnologist.

Manual pressure was used for hemostasis and a sterile dressing was
placed.

No complications were encountered no significant blood loss was
encountered.

Patient tolerated the procedure well and remained hemodynamically
stable throughout.
IMPRESSION: Status post CT-guided bone marrow biopsy, with tissue specimen sent
to pathology for complete histopathologic analysis

## 2023-05-02 DIAGNOSIS — F332 Major depressive disorder, recurrent severe without psychotic features: Secondary | ICD-10-CM | POA: Diagnosis not present

## 2023-05-02 DIAGNOSIS — F5101 Primary insomnia: Secondary | ICD-10-CM | POA: Diagnosis not present

## 2023-07-19 DIAGNOSIS — Z0000 Encounter for general adult medical examination without abnormal findings: Secondary | ICD-10-CM | POA: Diagnosis not present

## 2023-07-19 DIAGNOSIS — E7800 Pure hypercholesterolemia, unspecified: Secondary | ICD-10-CM | POA: Diagnosis not present

## 2023-07-19 DIAGNOSIS — R635 Abnormal weight gain: Secondary | ICD-10-CM | POA: Diagnosis not present

## 2023-07-19 DIAGNOSIS — F331 Major depressive disorder, recurrent, moderate: Secondary | ICD-10-CM | POA: Diagnosis not present

## 2023-08-01 DIAGNOSIS — R635 Abnormal weight gain: Secondary | ICD-10-CM | POA: Diagnosis not present

## 2023-08-01 DIAGNOSIS — E7800 Pure hypercholesterolemia, unspecified: Secondary | ICD-10-CM | POA: Diagnosis not present

## 2023-08-01 DIAGNOSIS — Z0000 Encounter for general adult medical examination without abnormal findings: Secondary | ICD-10-CM | POA: Diagnosis not present

## 2023-08-02 DIAGNOSIS — F341 Dysthymic disorder: Secondary | ICD-10-CM | POA: Diagnosis not present

## 2023-08-02 DIAGNOSIS — F5101 Primary insomnia: Secondary | ICD-10-CM | POA: Diagnosis not present

## 2023-08-02 DIAGNOSIS — F332 Major depressive disorder, recurrent severe without psychotic features: Secondary | ICD-10-CM | POA: Diagnosis not present

## 2023-08-20 DIAGNOSIS — Z1211 Encounter for screening for malignant neoplasm of colon: Secondary | ICD-10-CM | POA: Diagnosis not present

## 2023-09-07 DIAGNOSIS — L814 Other melanin hyperpigmentation: Secondary | ICD-10-CM | POA: Diagnosis not present

## 2023-09-07 DIAGNOSIS — L578 Other skin changes due to chronic exposure to nonionizing radiation: Secondary | ICD-10-CM | POA: Diagnosis not present

## 2023-09-07 DIAGNOSIS — L820 Inflamed seborrheic keratosis: Secondary | ICD-10-CM | POA: Diagnosis not present

## 2023-09-07 DIAGNOSIS — D225 Melanocytic nevi of trunk: Secondary | ICD-10-CM | POA: Diagnosis not present

## 2023-09-07 DIAGNOSIS — L821 Other seborrheic keratosis: Secondary | ICD-10-CM | POA: Diagnosis not present

## 2023-09-07 DIAGNOSIS — L538 Other specified erythematous conditions: Secondary | ICD-10-CM | POA: Diagnosis not present

## 2023-10-11 DIAGNOSIS — F5101 Primary insomnia: Secondary | ICD-10-CM | POA: Diagnosis not present

## 2023-10-11 DIAGNOSIS — F332 Major depressive disorder, recurrent severe without psychotic features: Secondary | ICD-10-CM | POA: Diagnosis not present

## 2023-10-11 DIAGNOSIS — F341 Dysthymic disorder: Secondary | ICD-10-CM | POA: Diagnosis not present

## 2023-10-19 DIAGNOSIS — F332 Major depressive disorder, recurrent severe without psychotic features: Secondary | ICD-10-CM | POA: Diagnosis not present

## 2023-10-22 DIAGNOSIS — F332 Major depressive disorder, recurrent severe without psychotic features: Secondary | ICD-10-CM | POA: Diagnosis not present

## 2023-11-05 DIAGNOSIS — F332 Major depressive disorder, recurrent severe without psychotic features: Secondary | ICD-10-CM | POA: Diagnosis not present

## 2023-11-07 DIAGNOSIS — L719 Rosacea, unspecified: Secondary | ICD-10-CM | POA: Diagnosis not present

## 2023-11-07 DIAGNOSIS — H2512 Age-related nuclear cataract, left eye: Secondary | ICD-10-CM | POA: Diagnosis not present

## 2023-11-07 DIAGNOSIS — H25043 Posterior subcapsular polar age-related cataract, bilateral: Secondary | ICD-10-CM | POA: Diagnosis not present

## 2023-11-07 DIAGNOSIS — H3540 Unspecified peripheral retinal degeneration: Secondary | ICD-10-CM | POA: Diagnosis not present

## 2023-11-07 DIAGNOSIS — H2513 Age-related nuclear cataract, bilateral: Secondary | ICD-10-CM | POA: Diagnosis not present

## 2023-11-15 NOTE — Progress Notes (Signed)
 [facility] - Clinic Note  11/21/2023   CHIEF COMPLAINT Patient presents for Retina Evaluation  HISTORY OF PRESENT ILLNESS: Maria Burnett is a 53 y.o. female who presents to the clinic today for:  HPI     Retina Evaluation   In both eyes.  Associated Symptoms Floaters, Photophobia and Glare.  Negative for Flashes, Distortion, Blind Spot, Pain, Redness, Trauma, Scalp Tenderness, Jaw Claudication, Shoulder/Hip pain, Fever, Weight Loss and Fatigue.  Context:  distance vision and near vision.  Treatments tried include no treatments.  I, the attending physician,  performed the HPI with the patient and updated documentation appropriately.        Comments   Referral Dr. Meridee, retina clearance for cat sx, sched on 9/2. Pt states vision has been fuzzy, she prefers contacts but is unable to use the plastic contacts the dr has recommended.       Last edited by Valdemar Rogue, MD on 11/27/2023  3:19 AM.     Pt states she's scheduled for cataract sx on 9.2.25. No vision complaints.   Referring physician: Meridee Seltzer, MD [address] [place],  [place] [address]  HISTORICAL INFORMATION:  Selected notes from the MEDICAL RECORD NUMBER Referred by Dr. Meridee for retina clearance for cataract surgery LEE:  Ocular Hx- PMH-   CURRENT MEDICATIONS: No current outpatient medications on file. (Ophthalmic Drugs)   No current facility-administered medications for this visit. (Ophthalmic Drugs)   Current Outpatient Medications (Other)  Medication Sig   Albuterol Sulfate, sensor, (PROAIR DIGIHALER) 108 (90 Base) MCG/ACT AEPB Inhale into the lungs.   Budesonide-Formoterol Fumarate (SYMBICORT IN) Inhale into the lungs.   cetirizine (ZYRTEC) 10 MG tablet Take by mouth.   clonazePAM (KLONOPIN) 1 MG tablet Take 1 mg by mouth 3 (three) times daily as needed.   Multiple Vitamins-Minerals (MULTIVITAMIN PO) Take by mouth daily.   ondansetron  (ZOFRAN -ODT) 8 MG  disintegrating tablet Take 1 tablet by mouth 3 (three) times daily as needed.   CREON 36000-114000 units CPEP capsule Take 36,000 Units by mouth 4 (four) times daily.   doxycycline (VIBRA-TABS) 100 MG tablet Take 1 tablet by mouth 2 (two) times daily.   Esketamine HCl, 56 MG Dose, (SPRAVATO, 56 MG DOSE,) 28 MG/DEVICE SOPK Place into the nose.   Eszopiclone 3 MG TABS Take 3 mg by mouth at bedtime.   gabapentin (NEURONTIN) 600 MG tablet SMARTSIG:0.5-3 Tablet(s) By Mouth Every Night   traZODone (DESYREL) 100 MG tablet SMARTSIG:2-5 Tablet(s) By Mouth Every Night   vortioxetine HBr (TRINTELLIX) 20 MG TABS tablet    No current facility-administered medications for this visit. (Other)   REVIEW OF SYSTEMS: ROS   Negative for: Constitutional, Gastrointestinal, Neurological, Skin, Genitourinary, Musculoskeletal, HENT, Endocrine, Cardiovascular, Eyes, Respiratory, Psychiatric, Allergic/Imm, Heme/Lymph Last edited by Elnor Avelina RAMAN, COT on 11/21/2023  8:41 AM.     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    Meniere's disease    Migraine    Perimenopause    premature ovarian failure  PTSD (post-traumatic stress disorder)    Vertigo    Past Surgical History:  Procedure Laterality Date   HIATAL HERNIA REPAIR     OVARIAN CYST REMOVAL     FAMILY HISTORY Family History  Problem Relation Age of Onset   Hypertension Mother    Lung disease Father    Alcoholism Father    Hyperlipidemia Father    Breast cancer Sister    SOCIAL HISTORY Social History   Tobacco Use   Smoking status:  Never   Smokeless tobacco: Never   Tobacco comments:    Not smoked since her 75s - only 2 cigarettes per day for about one year.  Substance Use Topics   Alcohol use: Yes    Comment: Social - 1-2 glasses of wine    Drug use: No       OPHTHALMIC EXAM:  Base Eye Exam     Visual Acuity (Snellen - Linear)       Right Left   Dist cc 20/30 -2 20/40 +2   Dist ph cc NI NI    Correction: Glasses         Tonometry (Tonopen, 8:27 AM)       Right Left   Pressure 17 20         Pupils       Pupils Dark Light Shape React APD   Right PERRL 3 2 Round Brisk None   Left PERRL 3 2 Round Brisk None         Visual Fields       Left Right    Full Full         Extraocular Movement       Right Left    Full, Ortho Full, Ortho         Neuro/Psych     Oriented x3: Yes         Dilation     Both eyes: 1.0% Mydriacyl, 2.5% Phenylephrine @ 8:27 AM           Slit Lamp and Fundus Exam     Slit Lamp Exam       Right Left   Lids/Lashes Dermatochalasis Dermatochalasis   Conjunctiva/Sclera White and quiet White and quiet   Cornea trace tear film debris Clear   Anterior Chamber Deep and clear Deep and clear   Iris Round and dilated Round and dilated   Lens 1-2+ Nuclear sclerosis, 2+ Cortical cataract, 1+Posterior subcapsular cataract 2+ Nuclear sclerosis, 2-3+ Cortical cataract, trace Posterior subcapsular cataract   Anterior Vitreous mild syneresis mild syneresis         Fundus Exam       Right Left   Disc Pink and Sharp, Compact Pink and Sharp, Compact   C/D Ratio 0.6 0.4   Macula Flat, Good foveal reflex, mild RPE mottling Flat, Good foveal reflex, no heme or edema   Vessels mild attenuation, mild tortuosity mild attenuation   Periphery Attached, pavingstone degeneration inferiorly, no heme or edema Attached, pavingstone degeneration inferiorly, no heme or edema           Refraction     Wearing Rx       Sphere Cylinder Axis   Right -8.50 +1.50  143   Left -10.25 +2.00 013    Age: 76+         Manifest Refraction (Auto)       Sphere Cylinder Axis Dist VA   Right -8.25 +1.00 156 30-2   Left -10.00 +2.50 019 30-2  IMAGING AND PROCEDURES  Imaging and Procedures for 11/21/2023  OCT, Retina - OU - Both Eyes       Right Eye Quality was good. Central Foveal Thickness: 290. Progression has no prior data. Findings include normal foveal contour, no IRF, no SRF, myopic contour, vitreomacular adhesion .   Left Eye Quality was good. Central Foveal Thickness: 300. Progression has no prior data. Findings include normal foveal contour, no IRF, no SRF, vitreomacular adhesion .   Notes *Images captured and stored on drive  Diagnosis / Impression:  NFP; No IRF/SRF OU   Clinical management:  See below  Abbreviations: NFP - Normal foveal profile. CME - cystoid macular edema. PED - pigment epithelial detachment. IRF - intraretinal fluid. SRF - subretinal fluid. EZ - ellipsoid zone. ERM - epiretinal membrane. ORA - outer retinal atrophy. ORT - outer retinal tubulation. SRHM - subretinal hyper-reflective material. IRHM - intraretinal hyper-reflective material           ASSESSMENT/PLAN:   ICD-10-CM   1. 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 Combined forms of age-related cataract of both eyes  H25.813      1. High myopia w/ astigmatism OU - discussed association of high myopia with increased risk of RT/RD  - no retinal pathologies - clear from a retina standpoint to proceed with cataract surgery when pt and surgeon are ready  - f/u here PRN  2. Mixed Cataract OU - The symptoms of cataract, surgical options, and treatments and risks were discussed with patient. - pt under the expert care of Dr. Meridee - scheduled for cataract sx w/ Dr. Meridee 9.2.25 - clear from a retina standpoint to proceed with cataract surgery when pt and surgeon are ready    Ophthalmic Meds Ordered this visit:  No orders of the defined types were placed in this encounter.    Return if symptoms worsen or fail to improve.  There are no Patient Instructions on file for this visit.  Explained the diagnoses, plan, and follow up with the patient and they expressed understanding.  Patient expressed understanding of the importance of proper follow up care.    This document serves as a record of services personally performed by Redell JUDITHANN Hans, MD, PhD. It was created on their behalf by Almetta Pesa, an ophthalmic technician. The creation of this record is the provider's dictation and/or activities during the visit.    Electronically signed by: Almetta Pesa, OA, 11/27/23  3:19 AM  Redell JUDITHANN Hans, M.D., Ph.D. Diseases & Surgery of the Retina and Vitreous [facility] 11/21/2023  I have reviewed the above documentation for accuracy and completeness, and I agree with the above. Redell JUDITHANN Hans, M.D., Ph.D. 11/27/23 3:23 AM   Abbreviations: M myopia (nearsighted); A astigmatism; H hyperopia (farsighted); P presbyopia; Mrx spectacle prescription;  CTL contact lenses; OD right eye; OS left eye; OU both eyes  XT exotropia; ET esotropia; PEK punctate epithelial keratitis; PEE punctate epithelial erosions; DES dry eye syndrome; MGD meibomian gland dysfunction; ATs artificial tears; PFAT's preservative free artificial tears; NSC nuclear sclerotic cataract; PSC posterior subcapsular cataract; ERM epi-retinal membrane; PVD posterior vitreous detachment; RD retinal detachment; DM diabetes mellitus; DR diabetic retinopathy; NPDR non-proliferative diabetic retinopathy; PDR proliferative diabetic retinopathy; CSME clinically significant macular edema; DME diabetic macular edema; dbh dot blot hemorrhages; CWS cotton wool spot; POAG primary open angle glaucoma; C/D cup-to-disc ratio; HVF humphrey visual field; GVF  goldmann visual field; OCT optical  coherence tomography; IOP intraocular pressure; BRVO Branch retinal vein occlusion; CRVO central retinal vein occlusion; CRAO central retinal artery occlusion; BRAO branch retinal artery occlusion; RT retinal tear; SB scleral buckle; PPV pars plana vitrectomy; VH Vitreous hemorrhage; PRP panretinal laser photocoagulation; IVK intravitreal kenalog; VMT vitreomacular traction; MH Macular hole;  NVD neovascularization of the disc; NVE neovascularization elsewhere; AREDS age related eye disease study; ARMD age related macular degeneration; POAG primary open angle glaucoma; EBMD epithelial/anterior basement membrane dystrophy; ACIOL anterior chamber intraocular lens; IOL intraocular lens; PCIOL posterior chamber intraocular lens; Phaco/IOL phacoemulsification with intraocular lens placement; PRK photorefractive keratectomy; LASIK laser assisted in situ keratomileusis; HTN hypertension; DM diabetes mellitus; COPD chronic obstructive pulmonary disease

## 2023-11-21 ENCOUNTER — Ambulatory Visit (INDEPENDENT_AMBULATORY_CARE_PROVIDER_SITE_OTHER): Admitting: Ophthalmology

## 2023-11-21 ENCOUNTER — Encounter (INDEPENDENT_AMBULATORY_CARE_PROVIDER_SITE_OTHER): Payer: Self-pay | Admitting: Ophthalmology

## 2023-11-21 DIAGNOSIS — H25813 Combined forms of age-related cataract, bilateral: Secondary | ICD-10-CM

## 2023-11-21 DIAGNOSIS — H3581 Retinal edema: Secondary | ICD-10-CM

## 2023-11-21 DIAGNOSIS — H4423 Degenerative myopia, bilateral: Secondary | ICD-10-CM

## 2023-11-27 ENCOUNTER — Encounter (INDEPENDENT_AMBULATORY_CARE_PROVIDER_SITE_OTHER): Payer: Self-pay | Admitting: Ophthalmology

## 2023-12-02 IMAGING — CT CT HEAD W/O CM
4 series · 16 of 47 positions shown, 18 images · non-contrast
Comparison: No pertinent prior exams available for comparison.

CLINICAL DATA: Provided history: Tremor. Additional history
provided by scanning technologist: Patient reports tremor, "foggy
head," fainting, dizziness, nausea, vertigo.



[Series 2: head 5.00 hr40 s3 axial ibhc · axial · 0.43mm/px · z∈[-657,-537]mm · 6 of 34 slices shown, 8 images]
[im 5/34  brain]
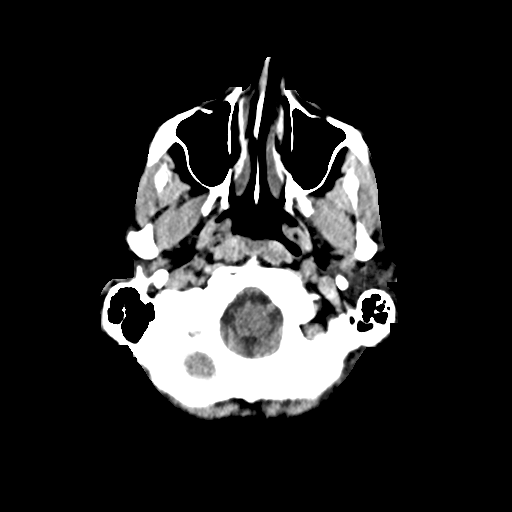
[im 5/34  bone]
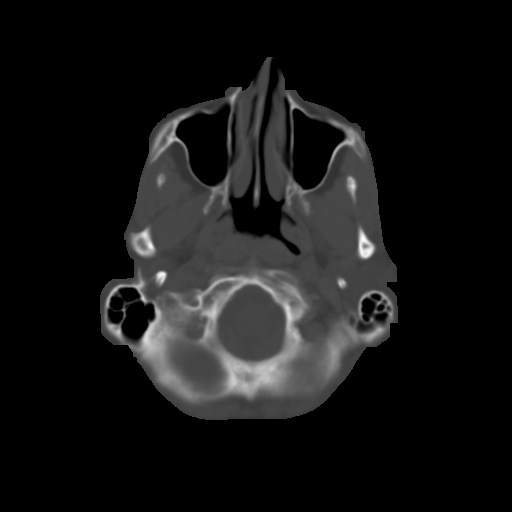
[im 10/34  brain]
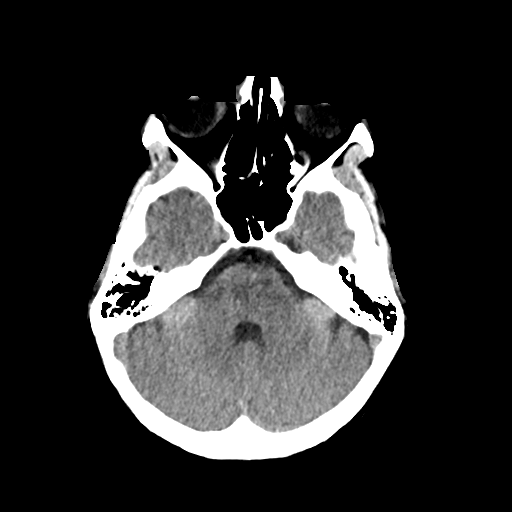
[im 15/34  brain]
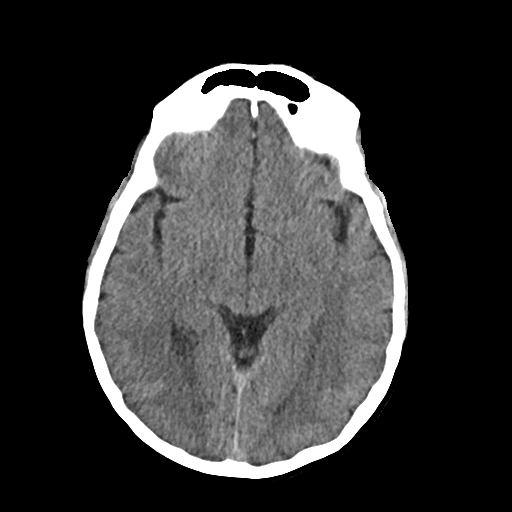
[im 19/34  brain]
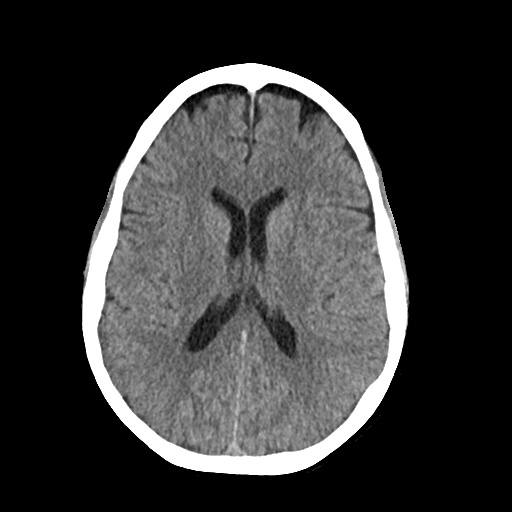
[im 24/34  brain]
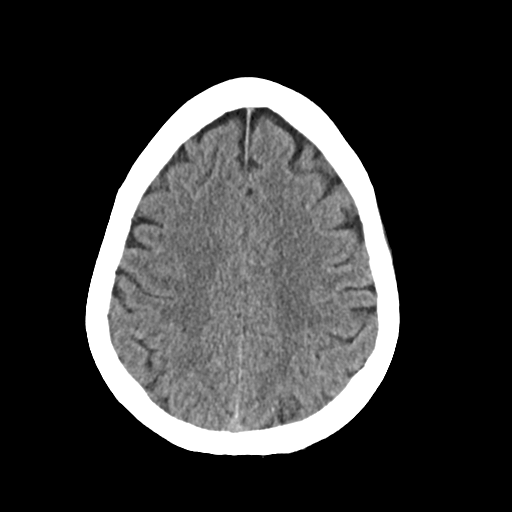
[im 24/34  bone]
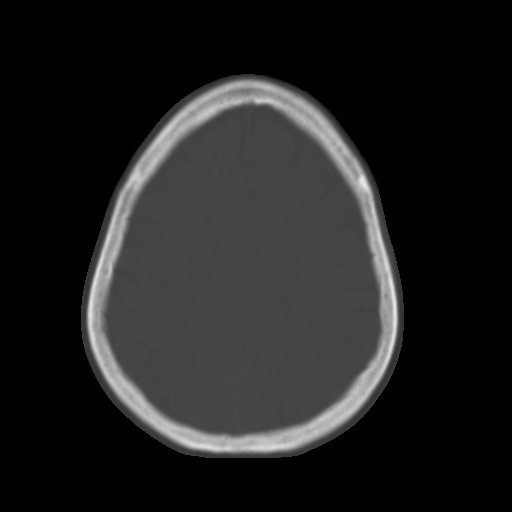
[im 29/34  brain]
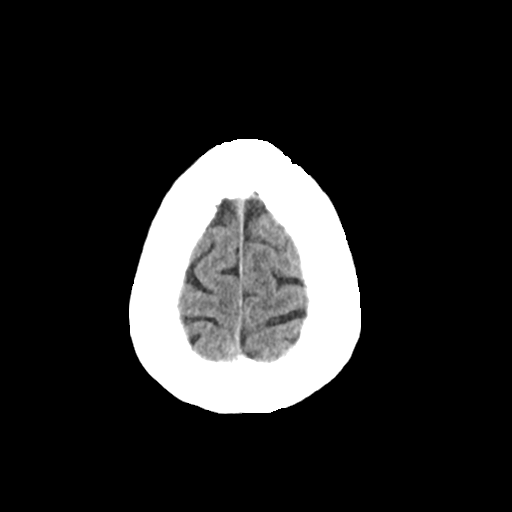

[Series 3: head 2.00 hr60 s3 axial bone · axial · 0.43mm/px · z∈[-663,-607]mm · 4 of 86 slices shown]
[im 9/86  bone]
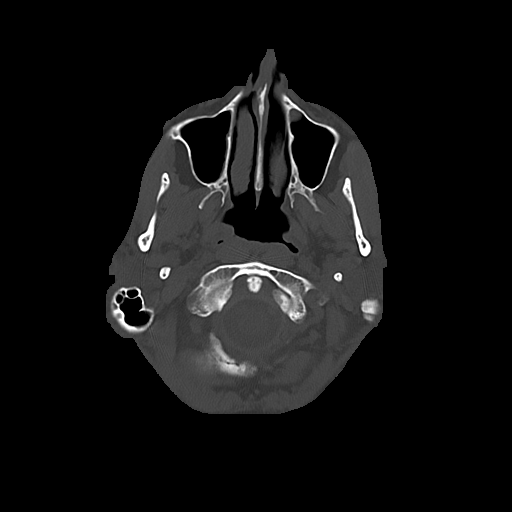
[im 17/86  bone]
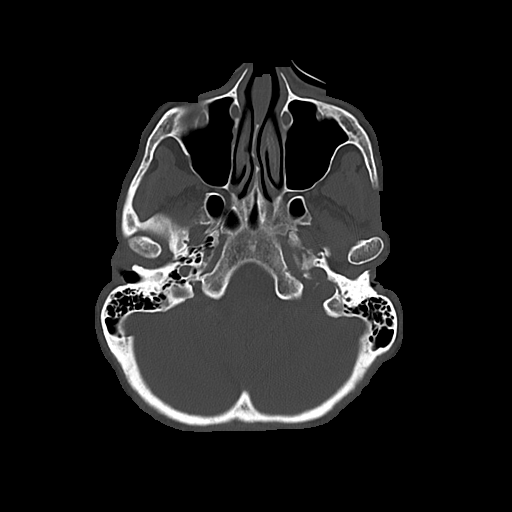
[im 29/86  bone]
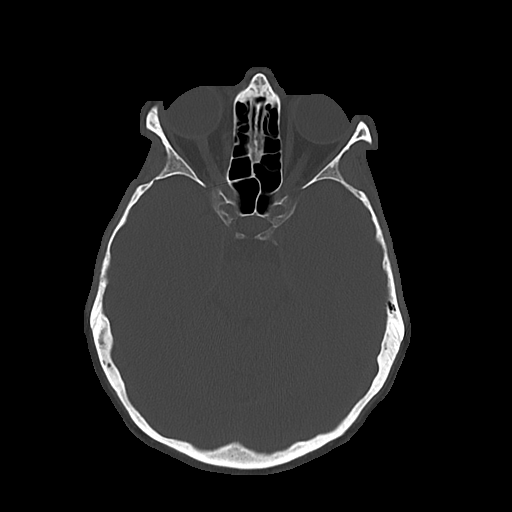
[im 37/86  bone]
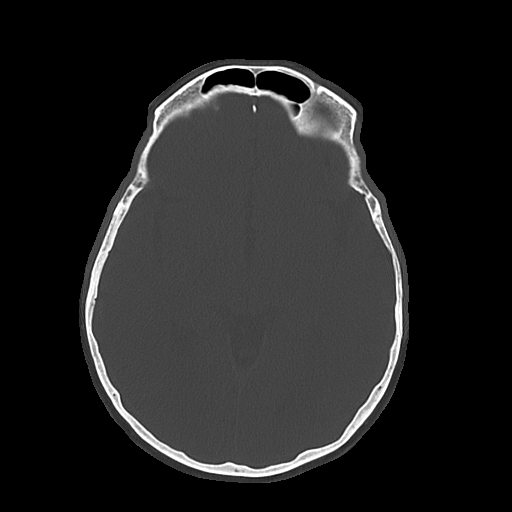

[Series 4: head 3.00 hr40 s3 sag · sagittal · 0.34mm/px · 3 of 57 slices shown]
[im 19/57  brain]
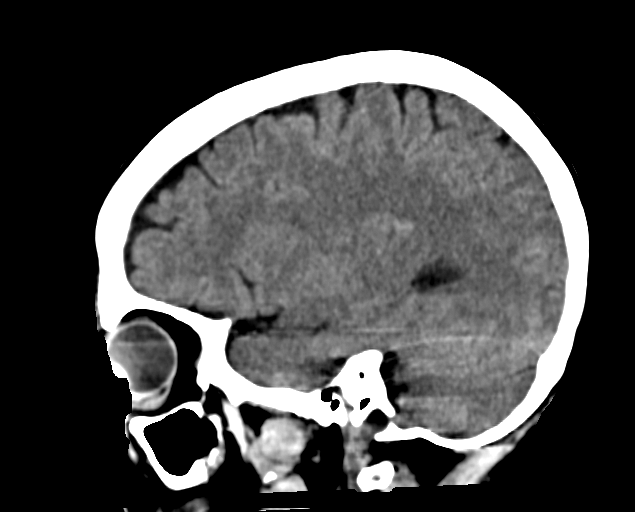
[im 29/57  brain]
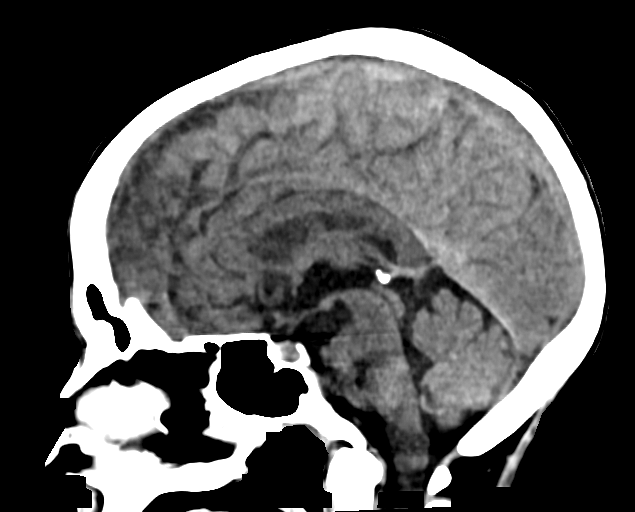
[im 38/57  brain]
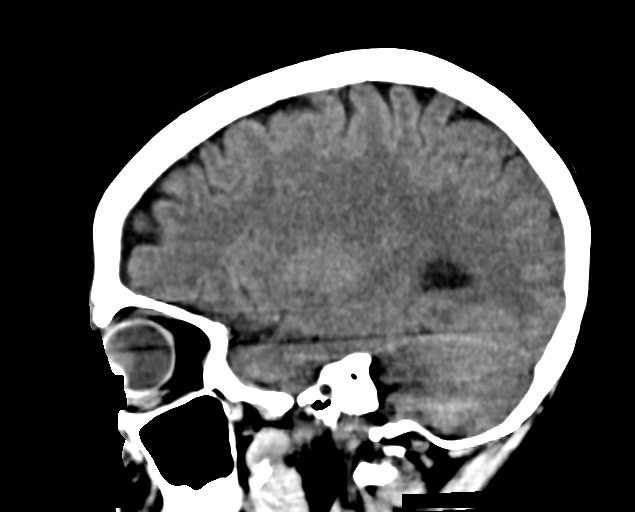

[Series 6: head 3.00 hr40 s3 cor · coronal · 0.33mm/px · 3 of 71 slices shown]
[im 24/71  brain]
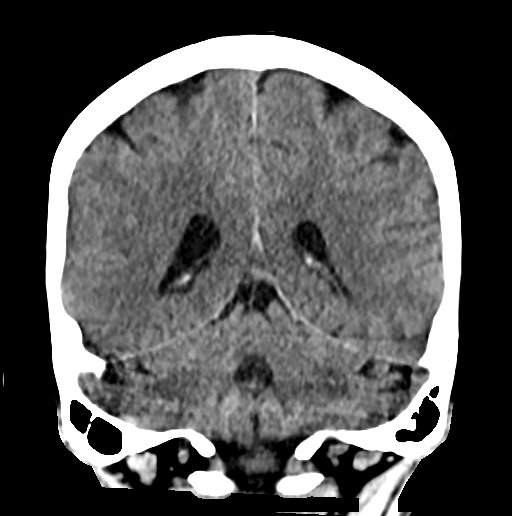
[im 32/71  brain]
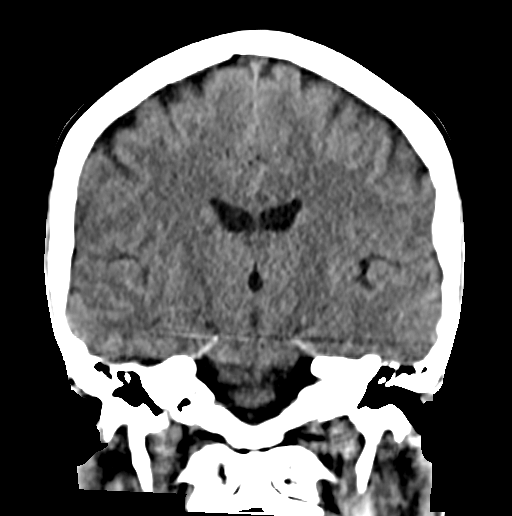
[im 39/71  brain]
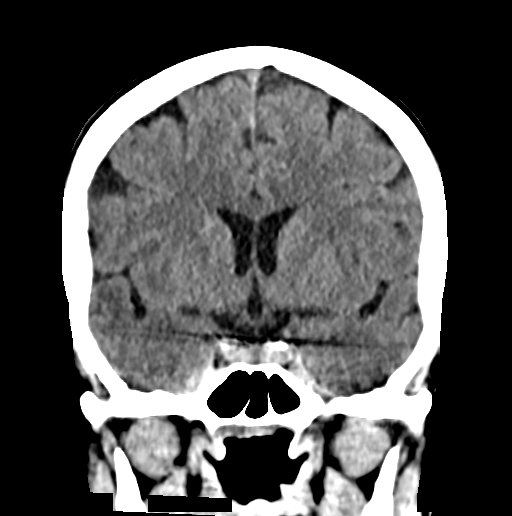

[16 of 47 positions shown; findings below may reference images not displayed]

FINDINGS: Brain:

Cerebral volume is normal.

There is no acute intracranial hemorrhage.

No demarcated cortical infarct.

No extra-axial fluid collection.

No evidence of an intracranial mass.

No midline shift.

Vascular: No hyperdense vessel.

Skull: Normal. Negative for fracture or focal lesion.

Sinuses/Orbits: Visualized orbits show no acute finding. Mild
mucosal thickening within the bilateral maxillary sinuses.
Superimposed small mucous retention cyst within the left maxillary
sinus. Minimal mucosal thickening within the bilateral ethmoid air
cells.
IMPRESSION: Unremarkable non-contrast CT appearance of the brain. No evidence of
acute intracranial abnormality.

Paranasal sinus disease, as described.

## 2023-12-04 DIAGNOSIS — F418 Other specified anxiety disorders: Secondary | ICD-10-CM | POA: Diagnosis not present

## 2023-12-04 DIAGNOSIS — H25041 Posterior subcapsular polar age-related cataract, right eye: Secondary | ICD-10-CM | POA: Diagnosis not present

## 2023-12-04 DIAGNOSIS — H268 Other specified cataract: Secondary | ICD-10-CM | POA: Diagnosis not present

## 2023-12-04 DIAGNOSIS — H2511 Age-related nuclear cataract, right eye: Secondary | ICD-10-CM | POA: Diagnosis not present

## 2023-12-04 DIAGNOSIS — J45909 Unspecified asthma, uncomplicated: Secondary | ICD-10-CM | POA: Diagnosis not present

## 2023-12-04 DIAGNOSIS — H2512 Age-related nuclear cataract, left eye: Secondary | ICD-10-CM | POA: Diagnosis not present

## 2023-12-11 DIAGNOSIS — H268 Other specified cataract: Secondary | ICD-10-CM | POA: Diagnosis not present

## 2023-12-11 DIAGNOSIS — F418 Other specified anxiety disorders: Secondary | ICD-10-CM | POA: Diagnosis not present

## 2023-12-11 DIAGNOSIS — H25041 Posterior subcapsular polar age-related cataract, right eye: Secondary | ICD-10-CM | POA: Diagnosis not present

## 2023-12-11 DIAGNOSIS — J45909 Unspecified asthma, uncomplicated: Secondary | ICD-10-CM | POA: Diagnosis not present

## 2023-12-11 DIAGNOSIS — H2511 Age-related nuclear cataract, right eye: Secondary | ICD-10-CM | POA: Diagnosis not present

## 2024-01-17 DIAGNOSIS — F411 Generalized anxiety disorder: Secondary | ICD-10-CM | POA: Diagnosis not present

## 2024-01-17 DIAGNOSIS — F5101 Primary insomnia: Secondary | ICD-10-CM | POA: Diagnosis not present

## 2024-01-17 DIAGNOSIS — F331 Major depressive disorder, recurrent, moderate: Secondary | ICD-10-CM | POA: Diagnosis not present

## 2024-01-17 DIAGNOSIS — F341 Dysthymic disorder: Secondary | ICD-10-CM | POA: Diagnosis not present

## 2024-02-26 DIAGNOSIS — R635 Abnormal weight gain: Secondary | ICD-10-CM | POA: Diagnosis not present

## 2024-02-26 DIAGNOSIS — F332 Major depressive disorder, recurrent severe without psychotic features: Secondary | ICD-10-CM | POA: Diagnosis not present

## 2024-02-26 DIAGNOSIS — F4312 Post-traumatic stress disorder, chronic: Secondary | ICD-10-CM | POA: Diagnosis not present

## 2024-02-26 DIAGNOSIS — F411 Generalized anxiety disorder: Secondary | ICD-10-CM | POA: Diagnosis not present

## 2024-03-11 DIAGNOSIS — F331 Major depressive disorder, recurrent, moderate: Secondary | ICD-10-CM | POA: Diagnosis not present

## 2024-03-11 DIAGNOSIS — F5101 Primary insomnia: Secondary | ICD-10-CM | POA: Diagnosis not present

## 2024-03-11 DIAGNOSIS — F341 Dysthymic disorder: Secondary | ICD-10-CM | POA: Diagnosis not present

## 2024-03-11 DIAGNOSIS — F411 Generalized anxiety disorder: Secondary | ICD-10-CM | POA: Diagnosis not present
